# Patient Record
Sex: Male | Born: 1990 | Race: White | Hispanic: No | Marital: Single | Smoking: Never smoker
Health system: Southern US, Community
[De-identification: ages and names within clinical notes are randomized; demographics above are authoritative.]

## PROBLEM LIST (undated history)

## (undated) DIAGNOSIS — M1990 Unspecified osteoarthritis, unspecified site: Secondary | ICD-10-CM

## (undated) DIAGNOSIS — J302 Other seasonal allergic rhinitis: Secondary | ICD-10-CM

## (undated) DIAGNOSIS — N319 Neuromuscular dysfunction of bladder, unspecified: Secondary | ICD-10-CM

## (undated) DIAGNOSIS — Z8614 Personal history of Methicillin resistant Staphylococcus aureus infection: Secondary | ICD-10-CM

## (undated) DIAGNOSIS — F419 Anxiety disorder, unspecified: Secondary | ICD-10-CM

## (undated) DIAGNOSIS — Z8669 Personal history of other diseases of the nervous system and sense organs: Secondary | ICD-10-CM

## (undated) DIAGNOSIS — K219 Gastro-esophageal reflux disease without esophagitis: Secondary | ICD-10-CM

## (undated) DIAGNOSIS — Z9889 Other specified postprocedural states: Secondary | ICD-10-CM

## (undated) DIAGNOSIS — M549 Dorsalgia, unspecified: Secondary | ICD-10-CM

## (undated) DIAGNOSIS — N318 Other neuromuscular dysfunction of bladder: Secondary | ICD-10-CM

## (undated) DIAGNOSIS — R112 Nausea with vomiting, unspecified: Secondary | ICD-10-CM

## (undated) DIAGNOSIS — G809 Cerebral palsy, unspecified: Secondary | ICD-10-CM

## (undated) DIAGNOSIS — K5900 Constipation, unspecified: Secondary | ICD-10-CM

## (undated) DIAGNOSIS — H04123 Dry eye syndrome of bilateral lacrimal glands: Secondary | ICD-10-CM

## (undated) HISTORY — PX: LEG SURGERY: SHX1003

## (undated) HISTORY — PX: ESOPHAGOGASTRODUODENOSCOPY: SHX1529

## (undated) HISTORY — PX: PROGRAMABLE BACLOFEN PUMP REVISION: SHX2268

## (undated) HISTORY — PX: MYRINGOTOMY: SUR874

## (undated) HISTORY — PX: OTHER SURGICAL HISTORY: SHX169

---

## 2014-03-23 ENCOUNTER — Encounter (HOSPITAL_COMMUNITY): Payer: Self-pay | Admitting: Pharmacy Technician

## 2014-03-27 ENCOUNTER — Encounter (HOSPITAL_COMMUNITY)
Admission: RE | Admit: 2014-03-27 | Discharge: 2014-03-27 | Disposition: A | Discharge status: Home / Self Care | Payer: Medicare Other | Source: Ambulatory Visit | Attending: Oral and Maxillofacial Surgery | Admitting: Oral and Maxillofacial Surgery

## 2014-03-27 ENCOUNTER — Encounter (HOSPITAL_COMMUNITY): Payer: Self-pay

## 2014-03-27 ENCOUNTER — Ambulatory Visit (HOSPITAL_COMMUNITY)
Admission: RE | Admit: 2014-03-27 | Discharge: 2014-03-27 | Disposition: A | Discharge status: Home / Self Care | Payer: Medicare Other | Source: Ambulatory Visit | Attending: Anesthesiology | Admitting: Anesthesiology

## 2014-03-27 DIAGNOSIS — Z01810 Encounter for preprocedural cardiovascular examination: Secondary | ICD-10-CM | POA: Insufficient documentation

## 2014-03-27 DIAGNOSIS — G809 Cerebral palsy, unspecified: Secondary | ICD-10-CM | POA: Insufficient documentation

## 2014-03-27 DIAGNOSIS — Z01812 Encounter for preprocedural laboratory examination: Secondary | ICD-10-CM | POA: Insufficient documentation

## 2014-03-27 DIAGNOSIS — Z01818 Encounter for other preprocedural examination: Secondary | ICD-10-CM | POA: Insufficient documentation

## 2014-03-27 HISTORY — DX: Nausea with vomiting, unspecified: R11.2

## 2014-03-27 HISTORY — DX: Dry eye syndrome of bilateral lacrimal glands: H04.123

## 2014-03-27 HISTORY — DX: Neuromuscular dysfunction of bladder, unspecified: N31.9

## 2014-03-27 HISTORY — DX: Anxiety disorder, unspecified: F41.9

## 2014-03-27 HISTORY — DX: Gastro-esophageal reflux disease without esophagitis: K21.9

## 2014-03-27 HISTORY — DX: Nausea with vomiting, unspecified: Z98.890

## 2014-03-27 HISTORY — DX: Dorsalgia, unspecified: M54.9

## 2014-03-27 HISTORY — DX: Constipation, unspecified: K59.00

## 2014-03-27 HISTORY — DX: Personal history of other diseases of the nervous system and sense organs: Z86.69

## 2014-03-27 HISTORY — DX: Cerebral palsy, unspecified: G80.9

## 2014-03-27 HISTORY — DX: Personal history of Methicillin resistant Staphylococcus aureus infection: Z86.14

## 2014-03-27 HISTORY — DX: Other seasonal allergic rhinitis: J30.2

## 2014-03-27 HISTORY — DX: Other neuromuscular dysfunction of bladder: N31.8

## 2014-03-27 HISTORY — DX: Unspecified osteoarthritis, unspecified site: M19.90

## 2014-03-27 LAB — BASIC METABOLIC PANEL
BUN: 10 mg/dL (ref 6–23)
CO2: 25 meq/L (ref 19–32)
CREATININE: 0.78 mg/dL (ref 0.50–1.35)
Calcium: 9.7 mg/dL (ref 8.4–10.5)
Chloride: 101 mEq/L (ref 96–112)
GFR calc Af Amer: >90 mL/min (ref >90)
Glucose, Bld: 78 mg/dL (ref 70–99)
Potassium: 3.9 mEq/L (ref 3.7–5.3)
Sodium: 141 mEq/L (ref 137–147)

## 2014-03-27 LAB — CBC
HCT: 46.5 % (ref 39.0–52.0)
Hemoglobin: 15.8 g/dL (ref 13.0–17.0)
MCH: 30.4 pg (ref 26.0–34.0)
MCHC: 34 g/dL (ref 30.0–36.0)
MCV: 89.4 fL (ref 78.0–100.0)
PLATELETS: 210 10*3/uL (ref 150–400)
RBC: 5.2 MIL/uL (ref 4.22–5.81)
RDW: 12.3 % (ref 11.5–15.5)
WBC: 8.8 10*3/uL (ref 4.0–10.5)

## 2014-03-27 NOTE — Anesthesia Preprocedure Evaluation (Addendum)
Anesthesia Evaluation  Patient identified by MRN, date of birth, ID band Patient awake    Reviewed: Allergy & Precautions, H&P , NPO status , Patient's Chart, lab work & pertinent test results, reviewed documented beta blocker date and time   History of Anesthesia Complications (+) PONV and history of anesthetic complications  Airway Mallampati: I TM Distance: >3 FB Neck ROM: Full    Dental  (+) Teeth Intact, Dental Advisory Given   Pulmonary Recent URI ,  Sinus infection 03/30/14 - amoxicillin breath sounds clear to auscultation  Pulmonary exam normal       Cardiovascular negative cardio ROS  Rhythm:Regular Rate:Normal     Neuro/Psych  Headaches, Anxiety Cerebral palsy with spasticity  Inderal for migraines    GI/Hepatic Neg liver ROS, GERD-  Medicated and Controlled,  Endo/Other  negative endocrine ROS  Renal/GU negative Renal ROS Bladder dysfunction (spastic neurogenic bladder)      Musculoskeletal   Abdominal   Peds  Hematology negative hematology ROS (+)   Anesthesia Other Findings   Reproductive/Obstetrics negative OB ROS                         Anesthesia Physical Anesthesia Plan  ASA: III  Anesthesia Plan: General   Post-op Pain Management:    Induction: Intravenous  Airway Management Planned: Nasal ETT  Additional Equipment:   Intra-op Plan:   Post-operative Plan: Extubation in OR  Informed Consent: I have reviewed the patients History and Physical, chart, labs and discussed the procedure including the risks, benefits and alternatives for the proposed anesthesia with the patient or authorized representative who has indicated his/her understanding and acceptance.   Dental advisory given  Plan Discussed with:   Anesthesia Plan Comments: (See my note regarding anesthesia concerns: post-op N/V, wants foley (due to CP with neurogenic bladder), adhesive tape allergy  (paper tape ok). Shonna Chock, PA-C  Plan routine monitors, GETA)       Anesthesia Quick Evaluation

## 2014-03-27 NOTE — Pre-Procedure Instructions (Signed)
Jose FiremanJason Christensen  03/27/2014   Your procedure is scheduled on:  Mon, June 15 @ 7:30 AM  [facility]  at 5:30 AM.  Call this number if you have problems the morning of surgery: [phone]   Remember:   Do not eat food or drink liquids after midnight.   Take these medicines the morning of surgery with A SIP OF WATER: Zyrtec(Cetirizine),Toviaz(Fesoterodine),Nasonex(Mometasone),Eye Drops,Propranlol(Inderal),and Zoloft(Sertraline)              No Goody's,BC's,Aleve,Aspirin,Ibuprofen,Fish Oil,or any Herbal Medications   Do not wear jewelry  Do not wear lotions, powders, or colognes. You may wear deodorant.  Men may shave face and neck.  Do not bring valuables to the hospital.  [facility] is not responsible                  for any belongings or valuables.               Contacts, dentures or bridgework may not be worn into surgery.  Leave suitcase in the car. After surgery it may be brought to your room.  For patients admitted to the hospital, discharge time is determined by your                treatment team.               Patients discharged the day of surgery will not be allowed to drive  home.    Special Instructions:    [place] - Preparing for Surgery  Before surgery, you can play an important role.  Because skin is not sterile, your skin needs to be as free of germs as possible.  You can reduce the number of germs on you skin by washing with CHG (chlorahexidine gluconate) soap before surgery.  CHG is an antiseptic cleaner which kills germs and bonds with the skin to continue killing germs even after washing.  Please DO NOT use if you have an allergy to CHG or antibacterial soaps.  If your skin becomes reddened/irritated stop using the CHG and inform your nurse when you arrive at Short Stay.  Do not shave (including legs and underarms) for at least 48 hours prior to the first CHG shower.  You may shave your face.  Please follow these instructions  carefully:   1.  Shower with CHG Soap the night before surgery and the                                morning of Surgery.  2.  If you choose to wash your hair, wash your hair first as usual with your       normal shampoo.  3.  After you shampoo, rinse your hair and body thoroughly to remove the                      Shampoo.  4.  Use CHG as you would any other liquid soap.  You can apply chg directly       to the skin and wash gently with scrungie or a clean washcloth.  5.  Apply the CHG Soap to your body ONLY FROM THE NECK DOWN.        Do not use on open wounds or open sores.  Avoid contact with your eyes,       ears, mouth and genitals (private parts).  Wash genitals (private parts)  with your normal soap.  6.  Wash thoroughly, paying special attention to the area where your surgery        will be performed.  7.  Thoroughly rinse your body with warm water from the neck down.  8.  DO NOT shower/wash with your normal soap after using and rinsing off       the CHG Soap.  9.  Pat yourself dry with a clean towel.            10.  Wear clean pajamas.            11.  Place clean sheets on your bed the night of your first shower and do not        sleep with pets.  Day of Surgery  Do not apply any lotions/deoderants the morning of surgery.  Please wear clean clothes to the hospital/surgery center.  Con Please read over the following fact sheets that you were given: Pain Booklet, Coughing and Deep Breathing and Surgical Site Infection Prevention

## 2014-03-27 NOTE — Progress Notes (Signed)
Anesthesia PAT Evaluation:  Patient is a 23 year old male scheduled for surgical extraction of #1 and # 16 and difficult extraction of impacted # 17 and #32 by Dr Jeanice Lim on 04/03/14.  History includes non-smoker, CP with left hemiparesis, migraines (on propanolol), arthritis, back pain, GERD, constipation, spastic neurogenic bladder (self catheterizes), anxiety, MRSA, dry eyes, baclofen pump, multiple surgeries (primarily at [facility]).    Anesthesia concerns: 1) Post-operative N/V (generally does okay as long was he is given anti-emetic), 2) need for foley catheter prior to surgery, 3) allergy to adhesive tape (develops rash and itching)--says he can only use paper tape. Mom reports that he is somewhat anxious about having surgery at [facility] since all of his other surgeries have been thru [facility].   I spoke with patient and his mother during his PAT visit.  He is A&O, appropriate.  He is in a motorized wheelchair.  He denies any cardiopulmonary issues.  No recent URI.  His mom reports that he passed a prior swallow evaluation, but she thinks that he is an aspiration risk. She also says that he snores loudly, but has never been tested for OSA.  Heart RRR, no murmur.  Lungs clear.  Good mouth opening.   Preoperative EKG, CXR, and labs noted.  I did request the last two anesthesia records from [facility], but these are still pending.  If no acute changes then I would anticipate that he could proceed as planned.    Velna Ochs [facility] Short Stay Center/Anesthesiology Phone [phone] 03/27/2014 6:33 PM

## 2014-03-27 NOTE — Progress Notes (Signed)
Self catherization every 2hrs;with surgery when IV starts mom states he will start going and will need cath prior to surgery

## 2014-03-27 NOTE — Progress Notes (Signed)
Pt doesn't have a cardiologist  Denies ever having an echo/stress test/heart cath  Denies EKG or CXR in past yr  Medical is [facility]

## 2014-03-28 NOTE — H&P (Signed)
Jose Christensen is an 23 y.o. male.   Chief Complaint: Wisdom Teeth  HPI: Jose Christensen is a delightful 23 year old male with a history of cerebral palsy whom was referred to my office for extraction of wisdom teeth.   Due to his muscle contracture and limited movement from his wheel chair; it is better for him to have his surgery performed at the [facility] operating room.    PMHx:  Past Medical History  Diagnosis Date  . Cerebral palsy   . PONV (postoperative nausea and vomiting)   . History of migraine     takes Propranolol daily;last migraine a couple of months ago;mom states he spaces out and looses orientation to time  . Arthritis     hips  . Back pain     scoliosis  . GERD (gastroesophageal reflux disease)     takes Nexium daily   . Constipation     takes Miralax daily  . Spastic neurogenic bladder     takes toviaz daily  . Anxiety     takes Zoloft daily  . Dry eyes     occasionally  . Seasonal allergies     uses Nasonex daily and takes Zyrtec daily  . History of MRSA infection     several yrs ago    PSx:  Past Surgical History  Procedure Laterality Date  . Orthodpedic surgeries      total of 23-metal plates in both hips  . Arm surgery Bilateral     several times  . Leg surgery Right     titanium rod  . Programable baclofen pump revision      x2   . Bilateral eye surgery      couple of times  . Myringotomy    . Esophagogastroduodenoscopy      Family Hx: No family history on file.  Social History:  reports that he has never smoked. He does not have any smokeless tobacco history on file. He reports that he does not drink alcohol or use illicit drugs.  Allergies:  Allergies  Allergen Reactions  . Adhesive [Tape] Other (See Comments)    Takes skin off  . Compazine [Prochlorperazine] Other (See Comments)    Altered mental  . Ditropan [Oxybutynin] Other (See Comments)    Beet red  . Motrin [Ibuprofen] Other (See Comments)    Renal bleeding  . Prevacid [Lansoprazole]  Other (See Comments)    Internal pump was affected  . Reglan [Metoclopramide] Other (See Comments)    Altered mental  . Tegretol [Carbamazepine] Other (See Comments)    Vision changes  . Erythromycin Rash  . Sulfa Antibiotics Rash    Meds:  No prescriptions prior to admission    Labs:  Results for orders placed during the hospital encounter of 03/27/14 (from the past 48 hour(s))  BASIC METABOLIC PANEL     Status: None   Collection Time    03/27/14  1:44 PM      Result Value Ref Range   Sodium 141  137 - 147 mEq/L   Potassium 3.9  3.7 - 5.3 mEq/L   Chloride 101  96 - 112 mEq/L   CO2 25  19 - 32 mEq/L   Glucose, Bld 78  70 - 99 mg/dL   BUN 10  6 - 23 mg/dL   Creatinine, Ser 0.78  0.50 - 1.35 mg/dL   Calcium 9.7  8.4 - 10.5 mg/dL   GFR calc non Af Amer >90  >90 mL/min   GFR  calc Af Amer >90  >90 mL/min   Comment: (NOTE)     The eGFR has been calculated using the CKD EPI equation.     This calculation has not been validated in all clinical situations.     eGFR's persistently <90 mL/min signify possible Chronic Kidney     Disease.  CBC     Status: None   Collection Time    03/27/14  1:44 PM      Result Value Ref Range   WBC 8.8  4.0 - 10.5 K/uL   RBC 5.20  4.22 - 5.81 MIL/uL   Hemoglobin 15.8  13.0 - 17.0 g/dL   HCT 46.5  39.0 - 52.0 %   MCV 89.4  78.0 - 100.0 fL   MCH 30.4  26.0 - 34.0 pg   MCHC 34.0  30.0 - 36.0 g/dL   RDW 12.3  11.5 - 15.5 %   Platelets 210  150 - 400 K/uL    Radiology: Dg Chest 2 View  03/27/2014   CLINICAL DATA:  Preoperative evaluation for wisdom teeth extraction, history cerebral palsy  EXAM: CHEST  2 VIEW  COMPARISON:  None  FINDINGS: Normal heart size, mediastinal contours and pulmonary vascularity.  Lungs clear.  No pleural effusion or pneumothorax.  Levoconvex thoracic scoliosis.  IMPRESSION: No acute abnormalities.   Electronically Signed   By: Lavonia Dana M.D.   On: 03/27/2014 14:27    ROS: Pertinent items are noted in HPI.  Vitals:  There were no vitals taken for this visit.  Physical Exam: General appearance: alert, cooperative and cerebral palsy Head: Normocephalic, without obvious abnormality, atraumatic Eyes: conjunctivae/corneas clear. PERRL, EOM's intact. Fundi benign. Ears: normal TM's and external ear canals both ears Nose: Nares normal. Septum midline. Mucosa normal. No drainage or sinus tenderness. Throat: lips, mucosa, and tongue normal; teeth and gums normal Resp: clear to auscultation bilaterally Cardio: regular rate and rhythm, S1, S2 normal, no murmur, click, rub or gallop GI: soft, non-tender; bowel sounds normal; no masses,  no organomegaly and Baclofen pump lower right quadrant abdomen Extremities: weakness at all extremities Pulses: 2+ and symmetric Full bony impacted #17, and 32 and erupted #1 and #16 which are occluding into the area of #17 and #32.  Assessment/Plan Non-functional #1, 16 and full bony impacted #17 and #32.  Extraction of #1, 16, 17, 32 in the OR . The patient will need a foley during the procedure.  [place]  03/28/2014, 4:36 PM

## 2014-04-02 MED ORDER — CEFAZOLIN SODIUM-DEXTROSE 2-3 GM-% IV SOLR
2.0000 g | INTRAVENOUS | Status: AC
  Administered 2014-04-03: 2 g via INTRAVENOUS
  Filled 2014-04-02: qty 50

## 2014-04-03 ENCOUNTER — Encounter (HOSPITAL_COMMUNITY)
Admission: RE | Disposition: A | Discharge status: Home / Self Care | Payer: Self-pay | Source: Ambulatory Visit | Attending: Oral and Maxillofacial Surgery

## 2014-04-03 ENCOUNTER — Encounter (HOSPITAL_COMMUNITY): Payer: Self-pay | Admitting: Pharmacy Technician

## 2014-04-03 ENCOUNTER — Ambulatory Visit (HOSPITAL_COMMUNITY): Payer: Medicare Other | Admitting: Anesthesiology

## 2014-04-03 ENCOUNTER — Encounter (HOSPITAL_COMMUNITY): Payer: Self-pay | Admitting: *Deleted

## 2014-04-03 ENCOUNTER — Ambulatory Visit (HOSPITAL_COMMUNITY)
Admission: RE | Admit: 2014-04-03 | Discharge: 2014-04-03 | Disposition: A | Discharge status: Home / Self Care | Payer: Medicare Other | Source: Ambulatory Visit | Attending: Oral and Maxillofacial Surgery | Admitting: Oral and Maxillofacial Surgery

## 2014-04-03 ENCOUNTER — Encounter (HOSPITAL_COMMUNITY): Payer: Medicare Other | Admitting: Vascular Surgery

## 2014-04-03 DIAGNOSIS — K011 Impacted teeth: Secondary | ICD-10-CM

## 2014-04-03 DIAGNOSIS — N319 Neuromuscular dysfunction of bladder, unspecified: Secondary | ICD-10-CM | POA: Insufficient documentation

## 2014-04-03 DIAGNOSIS — Z888 Allergy status to other drugs, medicaments and biological substances status: Secondary | ICD-10-CM | POA: Insufficient documentation

## 2014-04-03 DIAGNOSIS — G809 Cerebral palsy, unspecified: Secondary | ICD-10-CM | POA: Insufficient documentation

## 2014-04-03 DIAGNOSIS — K5900 Constipation, unspecified: Secondary | ICD-10-CM | POA: Insufficient documentation

## 2014-04-03 DIAGNOSIS — F411 Generalized anxiety disorder: Secondary | ICD-10-CM | POA: Insufficient documentation

## 2014-04-03 DIAGNOSIS — Z886 Allergy status to analgesic agent status: Secondary | ICD-10-CM | POA: Insufficient documentation

## 2014-04-03 DIAGNOSIS — K006 Disturbances in tooth eruption: Secondary | ICD-10-CM | POA: Insufficient documentation

## 2014-04-03 DIAGNOSIS — G43909 Migraine, unspecified, not intractable, without status migrainosus: Secondary | ICD-10-CM | POA: Insufficient documentation

## 2014-04-03 DIAGNOSIS — Z882 Allergy status to sulfonamides status: Secondary | ICD-10-CM | POA: Insufficient documentation

## 2014-04-03 DIAGNOSIS — M4120 Other idiopathic scoliosis, site unspecified: Secondary | ICD-10-CM | POA: Insufficient documentation

## 2014-04-03 DIAGNOSIS — M1610 Unilateral primary osteoarthritis, unspecified hip: Secondary | ICD-10-CM | POA: Insufficient documentation

## 2014-04-03 DIAGNOSIS — Z881 Allergy status to other antibiotic agents status: Secondary | ICD-10-CM | POA: Insufficient documentation

## 2014-04-03 DIAGNOSIS — K219 Gastro-esophageal reflux disease without esophagitis: Secondary | ICD-10-CM | POA: Insufficient documentation

## 2014-04-03 HISTORY — PX: TOOTH EXTRACTION: SHX859

## 2014-04-03 SURGERY — EXTRACTION, TOOTH, MOLAR
Anesthesia: General | Site: Mouth

## 2014-04-03 MED ORDER — NEOSTIGMINE METHYLSULFATE 10 MG/10ML IV SOLN
INTRAVENOUS | Status: DC | PRN
  Administered 2014-04-03: 4 mg via INTRAVENOUS

## 2014-04-03 MED ORDER — ONDANSETRON HCL 4 MG/2ML IJ SOLN
INTRAMUSCULAR | Status: AC
  Filled 2014-04-03: qty 2

## 2014-04-03 MED ORDER — PROPOFOL 10 MG/ML IV BOLUS
INTRAVENOUS | Status: AC
  Filled 2014-04-03: qty 20

## 2014-04-03 MED ORDER — FENTANYL CITRATE 0.05 MG/ML IJ SOLN
INTRAMUSCULAR | Status: DC | PRN
  Administered 2014-04-03 (×3): 50 ug via INTRAVENOUS

## 2014-04-03 MED ORDER — EPHEDRINE SULFATE 50 MG/ML IJ SOLN
INTRAMUSCULAR | Status: AC
  Filled 2014-04-03: qty 1

## 2014-04-03 MED ORDER — HYDROMORPHONE HCL PF 1 MG/ML IJ SOLN
0.2500 mg | INTRAMUSCULAR | Status: DC | PRN
Start: 2014-04-03 — End: 2014-04-03

## 2014-04-03 MED ORDER — LIDOCAINE HCL (CARDIAC) 20 MG/ML IV SOLN
INTRAVENOUS | Status: AC
  Filled 2014-04-03: qty 5

## 2014-04-03 MED ORDER — MEPERIDINE HCL 25 MG/ML IJ SOLN: 6.2500 mg | INTRAMUSCULAR | Status: DC | PRN

## 2014-04-03 MED ORDER — BUPIVACAINE-EPINEPHRINE 0.5% -1:200000 IJ SOLN
INTRAMUSCULAR | Status: DC | PRN
  Administered 2014-04-03: 7.2 mL

## 2014-04-03 MED ORDER — GLYCOPYRROLATE 0.2 MG/ML IJ SOLN
INTRAMUSCULAR | Status: DC | PRN
  Administered 2014-04-03: .8 mg via INTRAVENOUS

## 2014-04-03 MED ORDER — GLYCOPYRROLATE 0.2 MG/ML IJ SOLN
INTRAMUSCULAR | Status: AC
  Filled 2014-04-03: qty 2

## 2014-04-03 MED ORDER — LIDOCAINE-EPINEPHRINE 2 %-1:100000 IJ SOLN
INTRAMUSCULAR | Status: AC
  Filled 2014-04-03: qty 6.8

## 2014-04-03 MED ORDER — OXYCODONE HCL 5 MG/5ML PO SOLN
ORAL | Status: AC
  Filled 2014-04-03: qty 5

## 2014-04-03 MED ORDER — SUCCINYLCHOLINE CHLORIDE 20 MG/ML IJ SOLN
INTRAMUSCULAR | Status: AC
  Filled 2014-04-03: qty 1

## 2014-04-03 MED ORDER — SODIUM CHLORIDE 0.9 % IJ SOLN
INTRAMUSCULAR | Status: AC
  Filled 2014-04-03: qty 10

## 2014-04-03 MED ORDER — OXYMETAZOLINE HCL 0.05 % NA SOLN
NASAL | Status: DC | PRN
  Administered 2014-04-03: 1 via NASAL

## 2014-04-03 MED ORDER — ONDANSETRON HCL 4 MG/2ML IJ SOLN
INTRAMUSCULAR | Status: DC | PRN
  Administered 2014-04-03: 4 mg via INTRAVENOUS

## 2014-04-03 MED ORDER — ROCURONIUM BROMIDE 100 MG/10ML IV SOLN
INTRAVENOUS | Status: DC | PRN
  Administered 2014-04-03: 25 mg via INTRAVENOUS

## 2014-04-03 MED ORDER — FENTANYL CITRATE 0.05 MG/ML IJ SOLN
INTRAMUSCULAR | Status: AC
  Filled 2014-04-03: qty 5

## 2014-04-03 MED ORDER — OXYCODONE HCL 5 MG/5ML PO SOLN
5.0000 mg | Freq: Once | ORAL | Status: AC | PRN
  Administered 2014-04-03: 5 mg via ORAL

## 2014-04-03 MED ORDER — LIDOCAINE HCL (CARDIAC) 20 MG/ML IV SOLN
INTRAVENOUS | Status: DC | PRN
  Administered 2014-04-03: 30 mg via INTRAVENOUS

## 2014-04-03 MED ORDER — GELATIN ABSORBABLE 12-7 MM EX MISC
CUTANEOUS | Status: DC | PRN
  Administered 2014-04-03: 2

## 2014-04-03 MED ORDER — PHENYLEPHRINE 40 MCG/ML (10ML) SYRINGE FOR IV PUSH (FOR BLOOD PRESSURE SUPPORT)
PREFILLED_SYRINGE | INTRAVENOUS | Status: AC
  Filled 2014-04-03: qty 10

## 2014-04-03 MED ORDER — BUPIVACAINE-EPINEPHRINE (PF) 0.5% -1:200000 IJ SOLN
INTRAMUSCULAR | Status: AC
  Filled 2014-04-03: qty 7.2

## 2014-04-03 MED ORDER — ARTIFICIAL TEARS OP OINT
TOPICAL_OINTMENT | OPHTHALMIC | Status: AC
  Filled 2014-04-03: qty 3.5

## 2014-04-03 MED ORDER — ROCURONIUM BROMIDE 50 MG/5ML IV SOLN
INTRAVENOUS | Status: AC
  Filled 2014-04-03: qty 1

## 2014-04-03 MED ORDER — DEXAMETHASONE SODIUM PHOSPHATE 4 MG/ML IJ SOLN
INTRAMUSCULAR | Status: DC | PRN
  Administered 2014-04-03: 8 mg via INTRAVENOUS

## 2014-04-03 MED ORDER — PROPOFOL 10 MG/ML IV BOLUS
INTRAVENOUS | Status: DC | PRN
  Administered 2014-04-03: 200 mg via INTRAVENOUS

## 2014-04-03 MED ORDER — PROMETHAZINE HCL 25 MG/ML IJ SOLN: 6.2500 mg | INTRAMUSCULAR | Status: DC | PRN

## 2014-04-03 MED ORDER — MIDAZOLAM HCL 2 MG/2ML IJ SOLN: 0.5000 mg | Freq: Once | INTRAMUSCULAR | Status: DC | PRN

## 2014-04-03 MED ORDER — EPHEDRINE SULFATE 50 MG/ML IJ SOLN
INTRAMUSCULAR | Status: DC | PRN
  Administered 2014-04-03: 10 mg via INTRAVENOUS

## 2014-04-03 MED ORDER — LACTATED RINGERS IV SOLN
INTRAVENOUS | Status: DC | PRN
  Administered 2014-04-03 (×2): via INTRAVENOUS

## 2014-04-03 MED ORDER — GLYCOPYRROLATE 0.2 MG/ML IJ SOLN
INTRAMUSCULAR | Status: AC
  Filled 2014-04-03: qty 1

## 2014-04-03 MED ORDER — LIDOCAINE-EPINEPHRINE 2 %-1:100000 IJ SOLN
INTRAMUSCULAR | Status: AC
  Filled 2014-04-03: qty 3.4

## 2014-04-03 MED ORDER — MIDAZOLAM HCL 2 MG/2ML IJ SOLN
INTRAMUSCULAR | Status: AC
  Filled 2014-04-03: qty 2

## 2014-04-03 MED ORDER — MIDAZOLAM HCL 5 MG/5ML IJ SOLN
INTRAMUSCULAR | Status: DC | PRN
  Administered 2014-04-03: 1 mg via INTRAVENOUS

## 2014-04-03 MED ORDER — NEOSTIGMINE METHYLSULFATE 10 MG/10ML IV SOLN
INTRAVENOUS | Status: AC
  Filled 2014-04-03: qty 1

## 2014-04-03 MED ORDER — OXYMETAZOLINE HCL 0.05 % NA SOLN
NASAL | Status: AC
  Filled 2014-04-03: qty 15

## 2014-04-03 MED ORDER — OXYCODONE HCL 5 MG PO TABS: 5.0000 mg | ORAL_TABLET | Freq: Once | ORAL | Status: AC | PRN

## 2014-04-03 MED ORDER — LIDOCAINE-EPINEPHRINE 2 %-1:100000 IJ SOLN
INTRAMUSCULAR | Status: DC | PRN
  Administered 2014-04-03: 10.2 mL

## 2014-04-03 SURGICAL SUPPLY — 50 items
ALCOHOL ISOPROPYL (RUBBING) (MISCELLANEOUS) ×2 IMPLANT
ATTRACTOMAT 16X20 MAGNETIC DRP (DRAPES) ×2 IMPLANT
BLADE 10 SAFETY STRL DISP (BLADE) IMPLANT
BLADE SURG 15 STRL LF DISP TIS (BLADE) ×1 IMPLANT
BLADE SURG 15 STRL SS (BLADE) ×1
BUR CROSS CUT (BURR)
BUR CROSS CUT FISSURE 1.6 (BURR) ×2 IMPLANT
BUR RND FLUTED 2.5 (BURR) IMPLANT
BUR SRG MED 1.2XXCUT FSSR (BURR) IMPLANT
BUR SRG MED 1.6XXCUT FSSR (BURR) IMPLANT
BUR SRG MED 2.1XXCUT FSSR (BURR) IMPLANT
BUR STRYKR 2.5 FLUT MED (BURR) IMPLANT
BURR SRG MED 1.2XXCUT FSSR (BURR)
BURR SRG MED 1.6XXCUT FSSR (BURR)
BURR SRG MED 2.1XXCUT FSSR (BURR)
CANISTER SUCTION 2500CC (MISCELLANEOUS) ×2 IMPLANT
CLEANER TIP ELECTROSURG 2X2 (MISCELLANEOUS) IMPLANT
CONT SPEC STER OR (MISCELLANEOUS) IMPLANT
COVER SURGICAL LIGHT HANDLE (MISCELLANEOUS) ×2 IMPLANT
DRESSING ADAPTIC 1/2  N-ADH (PACKING) IMPLANT
ELECT COATED BLADE 2.86 ST (ELECTRODE) IMPLANT
ELECT REM PT RETURN 9FT ADLT (ELECTROSURGICAL) ×2
ELECTRODE REM PT RTRN 9FT ADLT (ELECTROSURGICAL) ×1 IMPLANT
GAUZE PACKING FOLDED 2  STR (GAUZE/BANDAGES/DRESSINGS) ×1
GAUZE PACKING FOLDED 2 STR (GAUZE/BANDAGES/DRESSINGS) ×1 IMPLANT
GLOVE BIO SURGEON STRL SZ7 (GLOVE) ×2 IMPLANT
GLOVE BIOGEL PI IND STRL 7.5 (GLOVE) ×1 IMPLANT
GLOVE BIOGEL PI INDICATOR 7.5 (GLOVE) ×1
GLOVE ORTHO TXT STRL SZ7.5 (GLOVE) ×2 IMPLANT
GLOVE SURG SS PI 7.0 STRL IVOR (GLOVE) ×2 IMPLANT
GOWN STRL REUS W/ TWL LRG LVL3 (GOWN DISPOSABLE) ×2 IMPLANT
GOWN STRL REUS W/TWL LRG LVL3 (GOWN DISPOSABLE) ×2
KIT BASIN OR (CUSTOM PROCEDURE TRAY) ×2 IMPLANT
KIT ROOM TURNOVER OR (KITS) ×2 IMPLANT
NEEDLE BLUNT 16X1.5 OR ONLY (NEEDLE) ×4 IMPLANT
NEEDLE DENTAL 27 LONG (NEEDLE) ×4 IMPLANT
NS IRRIG 1000ML POUR BTL (IV SOLUTION) ×2 IMPLANT
PAD ARMBOARD 7.5X6 YLW CONV (MISCELLANEOUS) ×4 IMPLANT
PENCIL BUTTON HOLSTER BLD 10FT (ELECTRODE) IMPLANT
SOLUTION BETADINE 4OZ (MISCELLANEOUS) ×2 IMPLANT
SPONGE GAUZE 4X4 12PLY (GAUZE/BANDAGES/DRESSINGS) IMPLANT
SPONGE SURGIFOAM ABS GEL 12-7 (HEMOSTASIS) ×4 IMPLANT
SUT CHROMIC 3 0 PS 2 (SUTURE) ×4 IMPLANT
SYR 50ML SLIP (SYRINGE) ×4 IMPLANT
TOOTHBRUSH ADULT (PERSONAL CARE ITEMS) ×2 IMPLANT
TOWEL OR 17X24 6PK STRL BLUE (TOWEL DISPOSABLE) IMPLANT
TOWEL OR 17X26 10 PK STRL BLUE (TOWEL DISPOSABLE) ×2 IMPLANT
TRAY ENT MC OR (CUSTOM PROCEDURE TRAY) ×2 IMPLANT
TRAY FOLEY CATH 14FR (SET/KITS/TRAYS/PACK) ×2 IMPLANT
WATER STERILE IRR 1000ML POUR (IV SOLUTION) IMPLANT

## 2014-04-03 NOTE — Discharge Instructions (Signed)
HOME CARE INSTRUCTIONS DENTAL PROCEDURES  MEDICATION: Some soreness and discomfort is normal following a dental procedure.  Use of a non-aspirin pain product, like acetaminophen, is recommended.  If pain is not relieved, please call the surgeon who performed the procedure.  ORAL HYGIENE: Brushing of the teeth should be resumed the day after surgery.  Begin slowly and softly.  In children, brushing should be done by the parent after every meal.  DIET: A balanced diet is very important during the healing process.   Liquids and soft foods are advisable.  Drink clear liquids at first, then progress to other liquids as tolerated.  If teeth were removed, do not use a straw for at least 2 days.  Try to limit between-meal snacks which are high in sugar.  ACTIVITY: Limit to quiet indoor activities for 24 hours following surgery.  RETURN TO SCHOOL OR WORK: You may return to school or work in a day or two, or as indicated by Designer, industrial/productyour surgeon.  GENERAL EXPECTATIONS:  -Bleeding is to be expected after teeth are removed.  The bleeding should slow down after several hours.  -Stitches may be in place, which will fall out by themselves.  If the child pulls them out, do not be concerned.  CALL YOUR DOCTOR IS THESE OCCUR:  -Temperature is 101 degrees or more.  -Persistent bright red bleeding.  -Severe pain.  Return to the doctor's office in two weeks. Call to make an appointment.  Patient Signature:  ________________________________________________________  Nurse's Signature:  ________________________________________________________

## 2014-04-03 NOTE — Brief Op Note (Signed)
04/03/2014  7:52 AM  PATIENT:  Jose Christensen  23 y.o. male  PRE-OPERATIVE DIAGNOSIS:  Nonfunctional teeth #1, 16; and Full bony impacted teeth 17  32  POST-OPERATIVE DIAGNOSIS: Non-functional teeth #1, 16; and Full bony impacted #17, 32  PROCEDURE:   Surgical extraction of #1 and #16 Difficult extraction of impacted #17 and #32  SURGEON:  Surgeon(s) and Role:    * Francene Findershristopher L [place], DDS - Primary  PHYSICIAN ASSISTANT: None  ASSISTANTS: Harrie ForemanNicole Beck   ANESTHESIA:   general  EBL:  Minimal  BLOOD ADMINISTERED:none  DRAINS: Urinary Catheter (Foley) - removed at the conclusion of case.  LOCAL MEDICATIONS USED:  0.5% MARCAINE 1:200,000 epinephrine - 4 carpules  and 2% LIDOCAINE with 1:100,000 epinephrine - 6 carpules  SPECIMEN:  No Specimen  DISPOSITION OF SPECIMEN:  N/A  COUNTS:  YES  TOURNIQUET:  * No tourniquets in log *  DICTATION: .Note written in EPIC  PLAN OF CARE: Discharge to home after PACU  PATIENT DISPOSITION:  PACU - hemodynamically stable.   Delay start of Pharmacological VTE agent (>24hrs) due to surgical blood loss or risk of bleeding: not applicable

## 2014-04-03 NOTE — Anesthesia Postprocedure Evaluation (Signed)
  Anesthesia Post-op Note  Patient: Jose Christensen  Procedure(s) Performed: Procedure(s): SURGICAL EXTRACTION TEETH #1 AND #16; DIFFICULT EXTRACTION OF IMPACTED TEETH #17 AND #32 (N/A)  Patient Location: PACU  Anesthesia Type:General  Level of Consciousness: awake, alert , oriented and patient cooperative  Airway and Oxygen Therapy: Patient Spontanous Breathing  Post-op Pain: none  Post-op Assessment: Post-op Vital signs reviewed, Patient's Cardiovascular Status Stable, Respiratory Function Stable, Patent Airway, No signs of Nausea or vomiting and Pain level controlled  Post-op Vital Signs: Reviewed and stable  Last Vitals:  Filed Vitals:   04/03/14 0940  BP: 145/87  Pulse: 72  Temp:   Resp: 13    Complications: No apparent anesthesia complications

## 2014-04-03 NOTE — Anesthesia Procedure Notes (Signed)
Procedure Name: Intubation Date/Time: 04/03/2014 7:48 AM Performed by: Lovie CholOCK, Breta Demedeiros K Pre-anesthesia Checklist: Patient identified, Emergency Drugs available, Suction available, Patient being monitored and Timeout performed Patient Re-evaluated:Patient Re-evaluated prior to inductionOxygen Delivery Method: Circle system utilized Preoxygenation: Pre-oxygenation with 100% oxygen Intubation Type: IV induction Ventilation: Mask ventilation without difficulty Laryngoscope Size: Mac and 3 Grade View: Grade I Nasal Tubes: Right and Nasal Rae Tube size: 7.5 mm Number of attempts: 1 Placement Confirmation: ETT inserted through vocal cords under direct vision,  positive ETCO2,  CO2 detector and breath sounds checked- equal and bilateral Secured at: 28 (@ hub) cm Tube secured with: Tape Dental Injury: Teeth and Oropharynx as per pre-operative assessment

## 2014-04-03 NOTE — Interval H&P Note (Signed)
History and Physical Interval Note:  04/03/2014 7:12 AM  Jose Christensen  has presented today for surgery, with the diagnosis of nonfunctional teeth 1, 16 difficult impacted teeth 17  32  The various methods of treatment have been discussed with the patient and family. After consideration of risks, benefits and other options for treatment, the patient has consented to  Procedure(s): SURGICAL EXTRACTION #1 & #16 and DIFFICULT EXTRACTION OF IMPACTED #17 & #32 (N/A) as a surgical intervention .  The patient's history has been reviewed, patient examined, no change in status, stable for surgery.  I have reviewed the patient's chart and labs.  Questions were answered to the patient's satisfaction.     [place],Jillianna Stanek L

## 2014-04-03 NOTE — Transfer of Care (Signed)
Immediate Anesthesia Transfer of Care Note  Patient: Jose Christensen  Procedure(s) Performed: Procedure(s): SURGICAL EXTRACTION TEETH #1 AND #16; DIFFICULT EXTRACTION OF IMPACTED TEETH #17 AND #32 (N/A)  Patient Location: PACU  Anesthesia Type:General  Level of Consciousness: awake, alert , oriented and patient cooperative  Airway & Oxygen Therapy: Patient Spontanous Breathing and Patient connected to nasal cannula oxygen  Post-op Assessment: Report given to PACU RN and Post -op Vital signs reviewed and stable  Post vital signs: Reviewed  Complications: No apparent anesthesia complications

## 2014-04-03 NOTE — Op Note (Signed)
04/03/2014  7:56 AM  PATIENT:  Jose Christensen  23 y.o. male  PRE-OPERATIVE DIAGNOSIS:  Nonfunctional teeth #1, 16; and Full bony impacted teeth 17  32  POST-OPERATIVE DIAGNOSIS: Non-functional teeth #1, 16; and Full bony impacted #17, 32  INDICATIONS FOR PROCEDURE: The patient is a 23 year old male with a history of cerebral palsy who was referred to our office by his general dentist for the extraction of his third molar teeth due to pain.  The patient was deemed appropriate for general anesthesia; however, he will be taken to the operating room for his procedure.    PROCEDURE:   Surgical extraction of #1 and #16 Difficult extraction of impacted #17 and #32  SURGEON:  Surgeon(s) and Role:    * Francene Findershristopher L [place], DDS - Primary  PHYSICIAN ASSISTANT: None  ASSISTANTS: Harrie ForemanNicole Beck   PROCEDURE IN DETAIL:   The patient was seen in the pre-operative area and his questions were answered.  The consent was signed and verified.  The history and physical was updated.  The patient was taken to the operating room by the anesthesia service and placed on the table in a supine position.  The patient was successfully intubated with a nasal tube.  Next, 0.5% MARCAINE 1:200,000 epinephrine (4 carpules)  and 2% LIDOCAINE with 1:100,000 epinephrine (6carpules) were used to anesthetize the patient the left and right maxilla and mandible in the region of #1, 16, 17, and 32.  Bilateral infiltrations in the maxilla were used.   Bilateral inferior alveolar and long buccal nerve blocks were used on the mandible.  Next, a 15 blade was used to make a full thickness mucoperiosteal flap with a distal release to expose #17.  A surgical drill was used to make a trough in the bone and then section the tooth.  The tooth was removed with hand instruments.  Copious irrigation was used with normal saline.  The incision was closed with 3-0 gut suture and this was then repeated for teeth #1, #16, and #32.  All four sites were  hemostatic at the end of the surgery.  An orogastric tube was passed in and out at the end of the surgery.    All counts were correct.  The patient was extubated and and taken to the recovery area in a stable fashion.  ANESTHESIA:   general  EBL:   Minimal  BLOOD ADMINISTERED:none  DRAINS: Urinary Catheter (Foley) - removed at the conclusion of the case  LOCAL MEDICATIONS USED:  0.5% MARCAINE 1:200,000 epinephrine - 4 carpules  and 2% LIDOCAINE with 1:100,000 epinephrine - 6 carpules  SPECIMEN:  No Specimen  DISPOSITION OF SPECIMEN:  N/A  COUNTS:  YES  TOURNIQUET:  * No tourniquets in log *  PLAN OF CARE: Discharge to home after PACU  PATIENT DISPOSITION:  PACU - hemodynamically stable.   Delay start of Pharmacological VTE agent (>24hrs) due to surgical blood loss or risk of bleeding: not applicable

## 2014-04-04 ENCOUNTER — Encounter (HOSPITAL_COMMUNITY): Payer: Self-pay | Admitting: Oral and Maxillofacial Surgery

## 2014-12-28 ENCOUNTER — Emergency Department (HOSPITAL_BASED_OUTPATIENT_CLINIC_OR_DEPARTMENT_OTHER)
Admission: EM | Admit: 2014-12-28 | Discharge: 2014-12-28 | Disposition: A | Discharge status: Home / Self Care | Payer: Medicare Other | Attending: Emergency Medicine | Admitting: Emergency Medicine

## 2014-12-28 ENCOUNTER — Emergency Department (HOSPITAL_BASED_OUTPATIENT_CLINIC_OR_DEPARTMENT_OTHER): Payer: Medicare Other

## 2014-12-28 ENCOUNTER — Encounter (HOSPITAL_BASED_OUTPATIENT_CLINIC_OR_DEPARTMENT_OTHER): Payer: Self-pay | Admitting: *Deleted

## 2014-12-28 ENCOUNTER — Other Ambulatory Visit (HOSPITAL_BASED_OUTPATIENT_CLINIC_OR_DEPARTMENT_OTHER): Payer: Medicare Other

## 2014-12-28 ENCOUNTER — Emergency Department: Payer: Medicare Other

## 2014-12-28 DIAGNOSIS — Z7951 Long term (current) use of inhaled steroids: Secondary | ICD-10-CM | POA: Diagnosis not present

## 2014-12-28 DIAGNOSIS — Y998 Other external cause status: Secondary | ICD-10-CM | POA: Diagnosis not present

## 2014-12-28 DIAGNOSIS — F419 Anxiety disorder, unspecified: Secondary | ICD-10-CM | POA: Diagnosis not present

## 2014-12-28 DIAGNOSIS — Z8669 Personal history of other diseases of the nervous system and sense organs: Secondary | ICD-10-CM | POA: Diagnosis not present

## 2014-12-28 DIAGNOSIS — Y9389 Activity, other specified: Secondary | ICD-10-CM | POA: Insufficient documentation

## 2014-12-28 DIAGNOSIS — M1990 Unspecified osteoarthritis, unspecified site: Secondary | ICD-10-CM | POA: Diagnosis not present

## 2014-12-28 DIAGNOSIS — M25562 Pain in left knee: Secondary | ICD-10-CM

## 2014-12-28 DIAGNOSIS — K219 Gastro-esophageal reflux disease without esophagitis: Secondary | ICD-10-CM | POA: Diagnosis not present

## 2014-12-28 DIAGNOSIS — Y9289 Other specified places as the place of occurrence of the external cause: Secondary | ICD-10-CM | POA: Insufficient documentation

## 2014-12-28 DIAGNOSIS — S8992XA Unspecified injury of left lower leg, initial encounter: Secondary | ICD-10-CM | POA: Diagnosis not present

## 2014-12-28 DIAGNOSIS — X58XXXA Exposure to other specified factors, initial encounter: Secondary | ICD-10-CM | POA: Diagnosis not present

## 2014-12-28 DIAGNOSIS — K5900 Constipation, unspecified: Secondary | ICD-10-CM | POA: Diagnosis not present

## 2014-12-28 DIAGNOSIS — Z8614 Personal history of Methicillin resistant Staphylococcus aureus infection: Secondary | ICD-10-CM | POA: Insufficient documentation

## 2014-12-28 DIAGNOSIS — Z8679 Personal history of other diseases of the circulatory system: Secondary | ICD-10-CM | POA: Diagnosis not present

## 2014-12-28 DIAGNOSIS — S8990XA Unspecified injury of unspecified lower leg, initial encounter: Secondary | ICD-10-CM

## 2014-12-28 DIAGNOSIS — Z79899 Other long term (current) drug therapy: Secondary | ICD-10-CM | POA: Insufficient documentation

## 2014-12-28 DIAGNOSIS — M419 Scoliosis, unspecified: Secondary | ICD-10-CM | POA: Insufficient documentation

## 2014-12-28 NOTE — Discharge Instructions (Signed)
Please call your doctor for a followup appointment within 24-48 hours. When you talk to your doctor please let them know that you were seen in the emergency department and have them acquire all of your records so that they can discuss the findings with you and formulate a treatment plan to fully care for your new and ongoing problems. Please call and set-up an appointment with your primary care provider Please call and set-up an appointment with your orthopedics Please keep knee sleeve on at all times Please apply cool compressions such as ice Please continue to monitor symptoms closely and if symptoms are to worsen or change (fever greater than 101, chills, sweating, nausea, vomiting, chest pain, shortness of breathe, difficulty breathing, weakness, numbness, tingling, worsening or changes to pain pattern, increased swelling, red streaks, swelling to the leg, changes to skin colored) please report back to the Emergency Department immediately.   Knee Pain The knee is the complex joint between your thigh and your lower leg. It is made up of bones, tendons, ligaments, and cartilage. The bones that make up the knee are:  The femur in the thigh.  The tibia and fibula in the lower leg.  The patella or kneecap riding in the groove on the lower femur. CAUSES  Knee pain is a common complaint with many causes. A few of these causes are:  Injury, such as:  A ruptured ligament or tendon injury.  Torn cartilage.  Medical conditions, such as:  Gout  Arthritis  Infections  Overuse, over training, or overdoing a physical activity. Knee pain can be minor or severe. Knee pain can accompany debilitating injury. Minor knee problems often respond well to self-care measures or get well on their own. More serious injuries may need medical intervention or even surgery. SYMPTOMS The knee is complex. Symptoms of knee problems can vary widely. Some of the problems are:  Pain with movement and weight  bearing.  Swelling and tenderness.  Buckling of the knee.  Inability to straighten or extend your knee.  Your knee locks and you cannot straighten it.  Warmth and redness with pain and fever.  Deformity or dislocation of the kneecap. DIAGNOSIS  Determining what is wrong may be very straight forward such as when there is an injury. It can also be challenging because of the complexity of the knee. Tests to make a diagnosis may include:  Your caregiver taking a history and doing a physical exam.  Routine X-rays can be used to rule out other problems. X-rays will not reveal a cartilage tear. Some injuries of the knee can be diagnosed by:  Arthroscopy a surgical technique by which a small video camera is inserted through tiny incisions on the sides of the knee. This procedure is used to examine and repair internal knee joint problems. Tiny instruments can be used during arthroscopy to repair the torn knee cartilage (meniscus).  Arthrography is a radiology technique. A contrast liquid is directly injected into the knee joint. Internal structures of the knee joint then become visible on X-ray film.  An MRI scan is a non X-ray radiology procedure in which magnetic fields and a computer produce two- or three-dimensional images of the inside of the knee. Cartilage tears are often visible using an MRI scanner. MRI scans have largely replaced arthrography in diagnosing cartilage tears of the knee.  Blood work.  Examination of the fluid that helps to lubricate the knee joint (synovial fluid). This is done by taking a sample out using a needle  and a syringe. TREATMENT The treatment of knee problems depends on the cause. Some of these treatments are:  Depending on the injury, proper casting, splinting, surgery, or physical therapy care will be needed.  Give yourself adequate recovery time. Do not overuse your joints. If you begin to get sore during workout routines, back off. Slow down or do fewer  repetitions.  For repetitive activities such as cycling or running, maintain your strength and nutrition.  Alternate muscle groups. For example, if you are a weight lifter, work the upper body on one day and the lower body the next.  Either tight or weak muscles do not give the proper support for your knee. Tight or weak muscles do not absorb the stress placed on the knee joint. Keep the muscles surrounding the knee strong.  Take care of mechanical problems.  If you have flat feet, orthotics or special shoes may help. See your caregiver if you need help.  Arch supports, sometimes with wedges on the inner or outer aspect of the heel, can help. These can shift pressure away from the side of the knee most bothered by osteoarthritis.  A brace called an "unloader" brace also may be used to help ease the pressure on the most arthritic side of the knee.  If your caregiver has prescribed crutches, braces, wraps or ice, use as directed. The acronym for this is PRICE. This means protection, rest, ice, compression, and elevation.  Nonsteroidal anti-inflammatory drugs (NSAIDs), can help relieve pain. But if taken immediately after an injury, they may actually increase swelling. Take NSAIDs with food in your stomach. Stop them if you develop stomach problems. Do not take these if you have a history of ulcers, stomach pain, or bleeding from the bowel. Do not take without your caregiver's approval if you have problems with fluid retention, heart failure, or kidney problems.  For ongoing knee problems, physical therapy may be helpful.  Glucosamine and chondroitin are over-the-counter dietary supplements. Both may help relieve the pain of osteoarthritis in the knee. These medicines are different from the usual anti-inflammatory drugs. Glucosamine may decrease the rate of cartilage destruction.  Injections of a corticosteroid drug into your knee joint may help reduce the symptoms of an arthritis flare-up. They  may provide pain relief that lasts a few months. You may have to wait a few months between injections. The injections do have a small increased risk of infection, water retention, and elevated blood sugar levels.  Hyaluronic acid injected into damaged joints may ease pain and provide lubrication. These injections may work by reducing inflammation. A series of shots may give relief for as long as 6 months.  Topical painkillers. Applying certain ointments to your skin may help relieve the pain and stiffness of osteoarthritis. Ask your pharmacist for suggestions. Many over the-counter products are approved for temporary relief of arthritis pain.  In some countries, doctors often prescribe topical NSAIDs for relief of chronic conditions such as arthritis and tendinitis. A review of treatment with NSAID creams found that they worked as well as oral medications but without the serious side effects. PREVENTION  Maintain a healthy weight. Extra pounds put more strain on your joints.  Get strong, stay limber. Weak muscles are a common cause of knee injuries. Stretching is important. Include flexibility exercises in your workouts.  Be smart about exercise. If you have osteoarthritis, chronic knee pain or recurring injuries, you may need to change the way you exercise. This does not mean you have to stop  being active. If your knees ache after jogging or playing basketball, consider switching to swimming, water aerobics, or other low-impact activities, at least for a few days a week. Sometimes limiting high-impact activities will provide relief.  Make sure your shoes fit well. Choose footwear that is right for your sport.  Protect your knees. Use the proper gear for knee-sensitive activities. Use kneepads when playing volleyball or laying carpet. Buckle your seat belt every time you drive. Most shattered kneecaps occur in car accidents.  Rest when you are tired. SEEK MEDICAL CARE IF:  You have knee pain  that is continual and does not seem to be getting better.  SEEK IMMEDIATE MEDICAL CARE IF:  Your knee joint feels hot to the touch and you have a high fever. MAKE SURE YOU:   Understand these instructions.  Will watch your condition.  Will get help right away if you are not doing well or get worse. Document Released: 08/03/2007 Document Revised: 12/29/2011 Document Reviewed: 08/03/2007 [facility] Patient Information 2015 [place], [place]. This information is not intended to replace advice given to you by your health care provider. Make sure you discuss any questions you have with your health care provider.

## 2014-12-28 NOTE — ED Provider Notes (Signed)
CSN: [id number]     Arrival date & time 12/28/14  1417 History   First MD Initiated Contact with Patient 12/28/14 1429     Chief Complaint  Patient presents with  . Knee Injury     (Consider location/radiation/quality/duration/timing/severity/associated sxs/prior Treatment) The history is provided by the patient and a parent. No language interpreter was used.  Jose Christensen is a 24 year old male with past medical history of cerebral palsy wheelchair-bound, arthritis, back pain, neurogenic bladder presenting to the emergency department with left knee pain that started this morning at approximately 10:30 AM. As per mother, reported that while at the store there are getting off the elevator and the patient's left foot into the resulting in his knee to twist area patient reports a deep aching sensation to the left knee. Mother reported numerous surgeries when the patient was younger secondary to cerebral palsy. Patient denied numbness, tingling, loss of sensation, swelling. PCP Dr. Mayford Knife Orthopedic Dr. Laurice Record at [facility]  Past Medical History  Diagnosis Date  . Cerebral palsy   . PONV (postoperative nausea and vomiting)   . History of migraine     takes Propranolol daily;last migraine a couple of months ago;mom states he spaces out and looses orientation to time  . Arthritis     hips  . Back pain     scoliosis  . GERD (gastroesophageal reflux disease)     takes Nexium daily   . Constipation     takes Miralax daily  . Spastic neurogenic bladder     takes toviaz daily  . Anxiety     takes Zoloft daily  . Dry eyes     occasionally  . Seasonal allergies     uses Nasonex daily and takes Zyrtec daily  . History of MRSA infection     several yrs ago   Past Surgical History  Procedure Laterality Date  . Orthodpedic surgeries      total of 23-metal plates in both hips  . Arm surgery Bilateral     several times  . Leg surgery Right     titanium rod  . Programable baclofen  pump revision      x2   . Bilateral eye surgery      couple of times  . Myringotomy    . Esophagogastroduodenoscopy    . Tooth extraction N/A 04/03/2014    Procedure: SURGICAL EXTRACTION TEETH #1 AND #16; DIFFICULT EXTRACTION OF IMPACTED TEETH #17 AND #32;  Surgeon: Francene Finders, DDS;  Location: [facility] OR;  Service: Oral Surgery;  Laterality: N/A;   No family history on file. History  Substance Use Topics  . Smoking status: Never Smoker   . Smokeless tobacco: Not on file  . Alcohol Use: No    Review of Systems  Musculoskeletal: Positive for arthralgias (left knee pain ).  Neurological: Negative for weakness and numbness.      Allergies  Adhesive; Compazine; Ditropan; Motrin; Prevacid; Reglan; Tegretol; Erythromycin; and Sulfa antibiotics  Home Medications   Prior to Admission medications   Medication Sig Start Date End Date Taking? Authorizing Provider  acetaminophen (TYLENOL) 500 MG tablet Take 500 mg by mouth every 6 (six) hours as needed for headache.    Historical Provider, MD  cetirizine (ZYRTEC) 10 MG tablet Take 10 mg by mouth daily.    Historical Provider, MD  desmopressin (DDAVP) 0.2 MG tablet Take 0.6 mg by mouth daily.    Historical Provider, MD  esomeprazole (NEXIUM) 40 MG capsule Take 40 mg  by mouth daily at 12 noon.    Historical Provider, MD  fesoterodine (TOVIAZ) 8 MG TB24 tablet Take 8 mg by mouth daily.    Historical Provider, MD  mometasone (NASONEX) 50 MCG/ACT nasal spray Place 2 sprays into the nose daily.    Historical Provider, MD  Olopatadine HCl (PATADAY) 0.2 % SOLN Place 1 drop into both eyes daily.    Historical Provider, MD  polyethylene glycol (MIRALAX / GLYCOLAX) packet Take 17 g by mouth daily.    Historical Provider, MD  PRESCRIPTION MEDICATION Baclofen pump continuous    Historical Provider, MD  propranolol ER (INDERAL LA) 160 MG SR capsule Take 160 mg by mouth daily.    Historical Provider, MD  sertraline (ZOLOFT) 50 MG tablet Take 75 mg  by mouth daily.    Historical Provider, MD  tiZANidine (ZANAFLEX) 2 MG tablet Take 1-2 mg by mouth 2 (two) times daily.  in the morning and  at bedtime    Historical Provider, MD   BP 125/76 mmHg  Pulse 82  Temp(Src) 97.9 F (36.6 C) (Oral)  Resp 18  Ht  (1.626 m)  Wt 164 lb (74.39 kg)  BMI 28.14 kg/m2  SpO2 100% Physical Exam  Constitutional: He is oriented to person, place, and time. He appears well-developed and well-nourished. No distress.  Cardiovascular: Normal rate, regular rhythm and normal heart sounds.  Exam reveals no friction rub.   No murmur heard. Pulses:      Radial pulses are 2+ on the right side, and 2+ on the left side.       Dorsalis pedis pulses are 2+ on the right side, and 2+ on the left side.  Pulmonary/Chest: Effort normal and breath sounds normal. No respiratory distress. He has no wheezes. He has no rales.  Musculoskeletal: He exhibits tenderness. He exhibits no edema.       Left knee: He exhibits normal range of motion and no swelling. Tenderness found. Medial joint line and MCL tenderness noted.  Patient is able to flex and extend the knee - minimal range of motion secondary to cerebral palsy.  Neurological: He is alert and oriented to person, place, and time.  Skin: He is not diaphoretic.  Nursing note and vitals reviewed.   ED Course  Procedures (including critical care time) Labs Review Labs Reviewed - No data to display  Imaging Review Dg Knee Complete 4 Views Left  12/28/2014   CLINICAL DATA:  Ran into wall.  EXAM: LEFT KNEE - COMPLETE 4+ VIEW  COMPARISON:  None.  FINDINGS: Mild diffuse osteopenia. There is no evidence of fracture, dislocation, or joint effusion. There is no evidence of arthropathy or other focal bone abnormality. Soft tissues are unremarkable.  IMPRESSION: Negative.   Electronically Signed   By: Signa Kell M.D.   On: 12/28/2014 15:00     EKG Interpretation None      MDM   Final diagnoses:  Left knee pain     Medications - No data to display  Filed Vitals:   12/28/14 1424 12/28/14 1425  BP:  125/76  Pulse:  82  Temp:  97.9 F (36.6 C)  TempSrc:  Oral  Resp: 18   Height:  (1.626 m)   Weight: 164 lb (74.39 kg)   SpO2:  100%   Plain film of left knee negative for acute osseous injury. Pulses palpable and strong. Range of motion identified to left knee without laxity. Negative deformities identified. Tenderness upon palpation to the medial  aspect of left knee-suspicion to be possible sprain. Negative signs of ischemia. Patient stable, afebrile. Patient not septic appearing. Discharged patient. Referred patient to PCP. Referred patient to orthopedics at [facility]. Discussed with patient to rest and stay hydrated. Discussed with patient to apply ice, elevate and rest. Discussed with patient to closely monitor symptoms and if symptoms are to worsen or change to report back to the ED - strict return instructions given.  Patient agreed to plan of care, understood, all questions answered.   Raymon MuttonMarissa Nicklous Aburto, PA-C 12/28/14 1541  Shon Batonourtney F Horton, MD 12/28/14 [phone]

## 2014-12-28 NOTE — ED Notes (Signed)
Pain in his left knee. He is in a wheel chair. His mom accidentally ran him into a wall causing his ankle to abduct twisting his knee.

## 2015-07-17 IMAGING — CR DG CHEST 2V
2 series · 2 of 2 positions shown · non-contrast
Comparison: None

CLINICAL DATA: Preoperative evaluation for wisdom teeth extraction,
history cerebral palsy

EXAM:
CHEST  2 VIEW

[x chest ap]
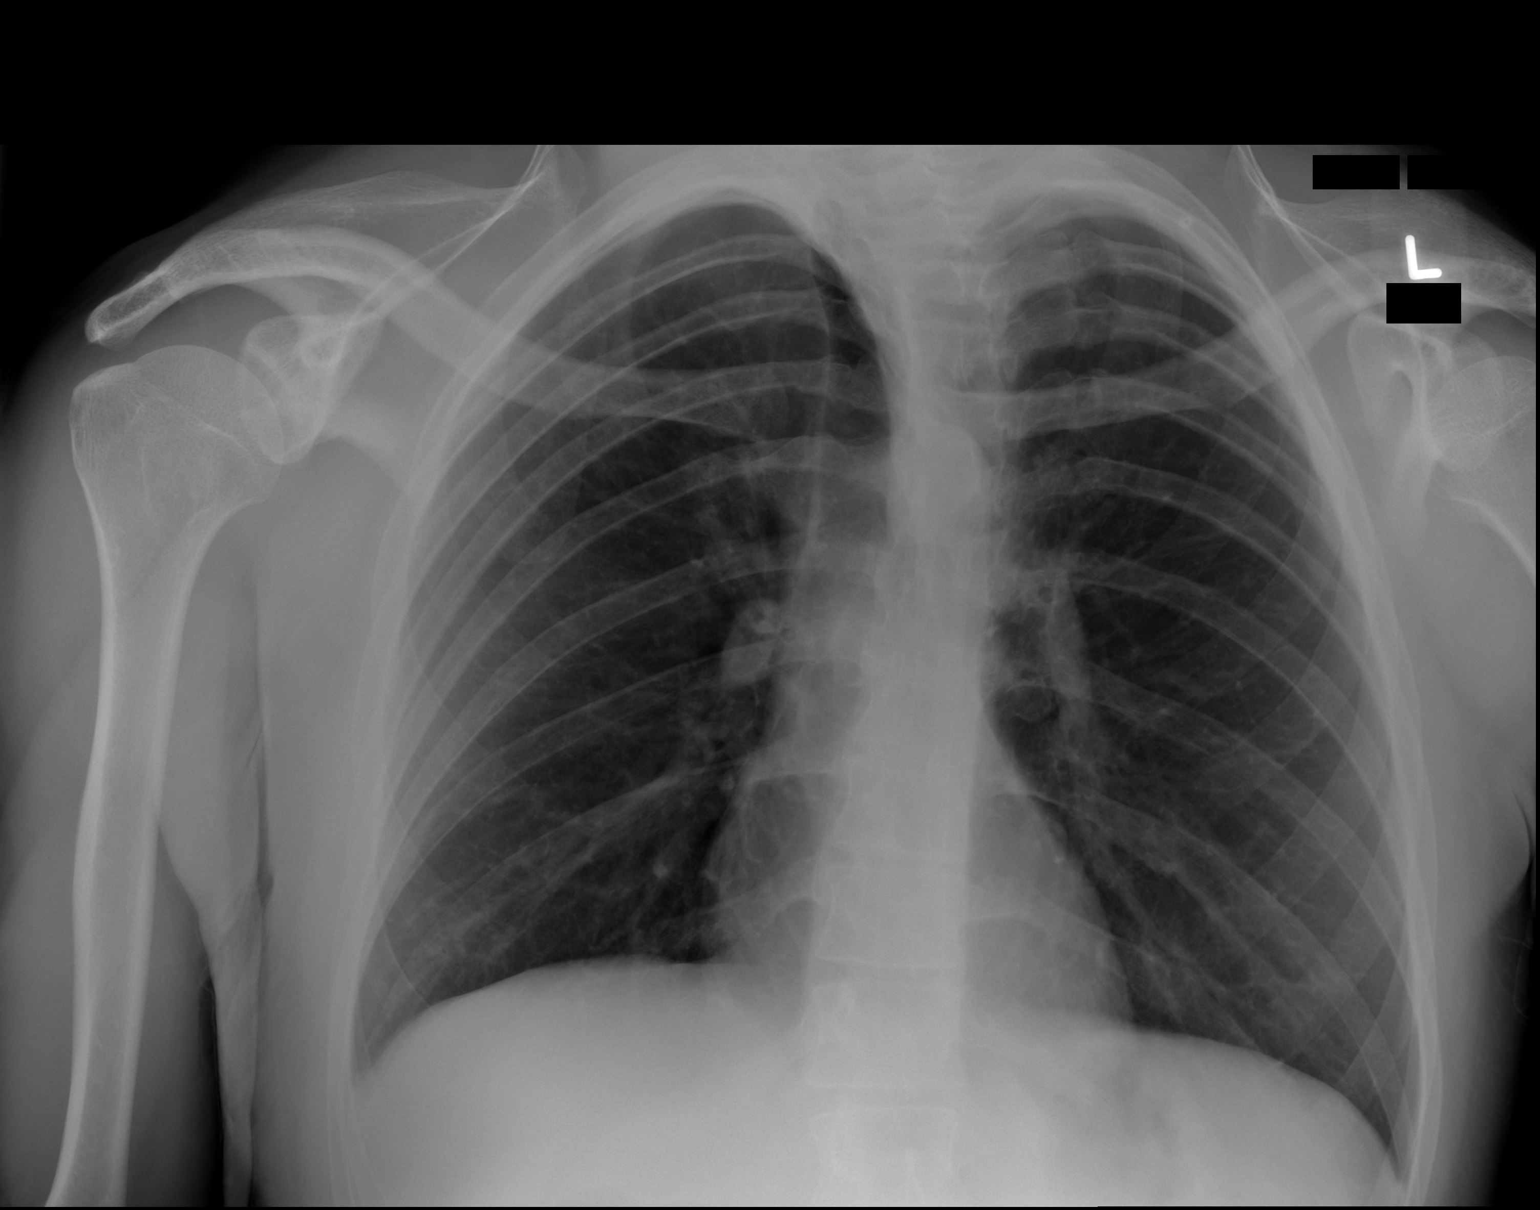

[w chest lat]
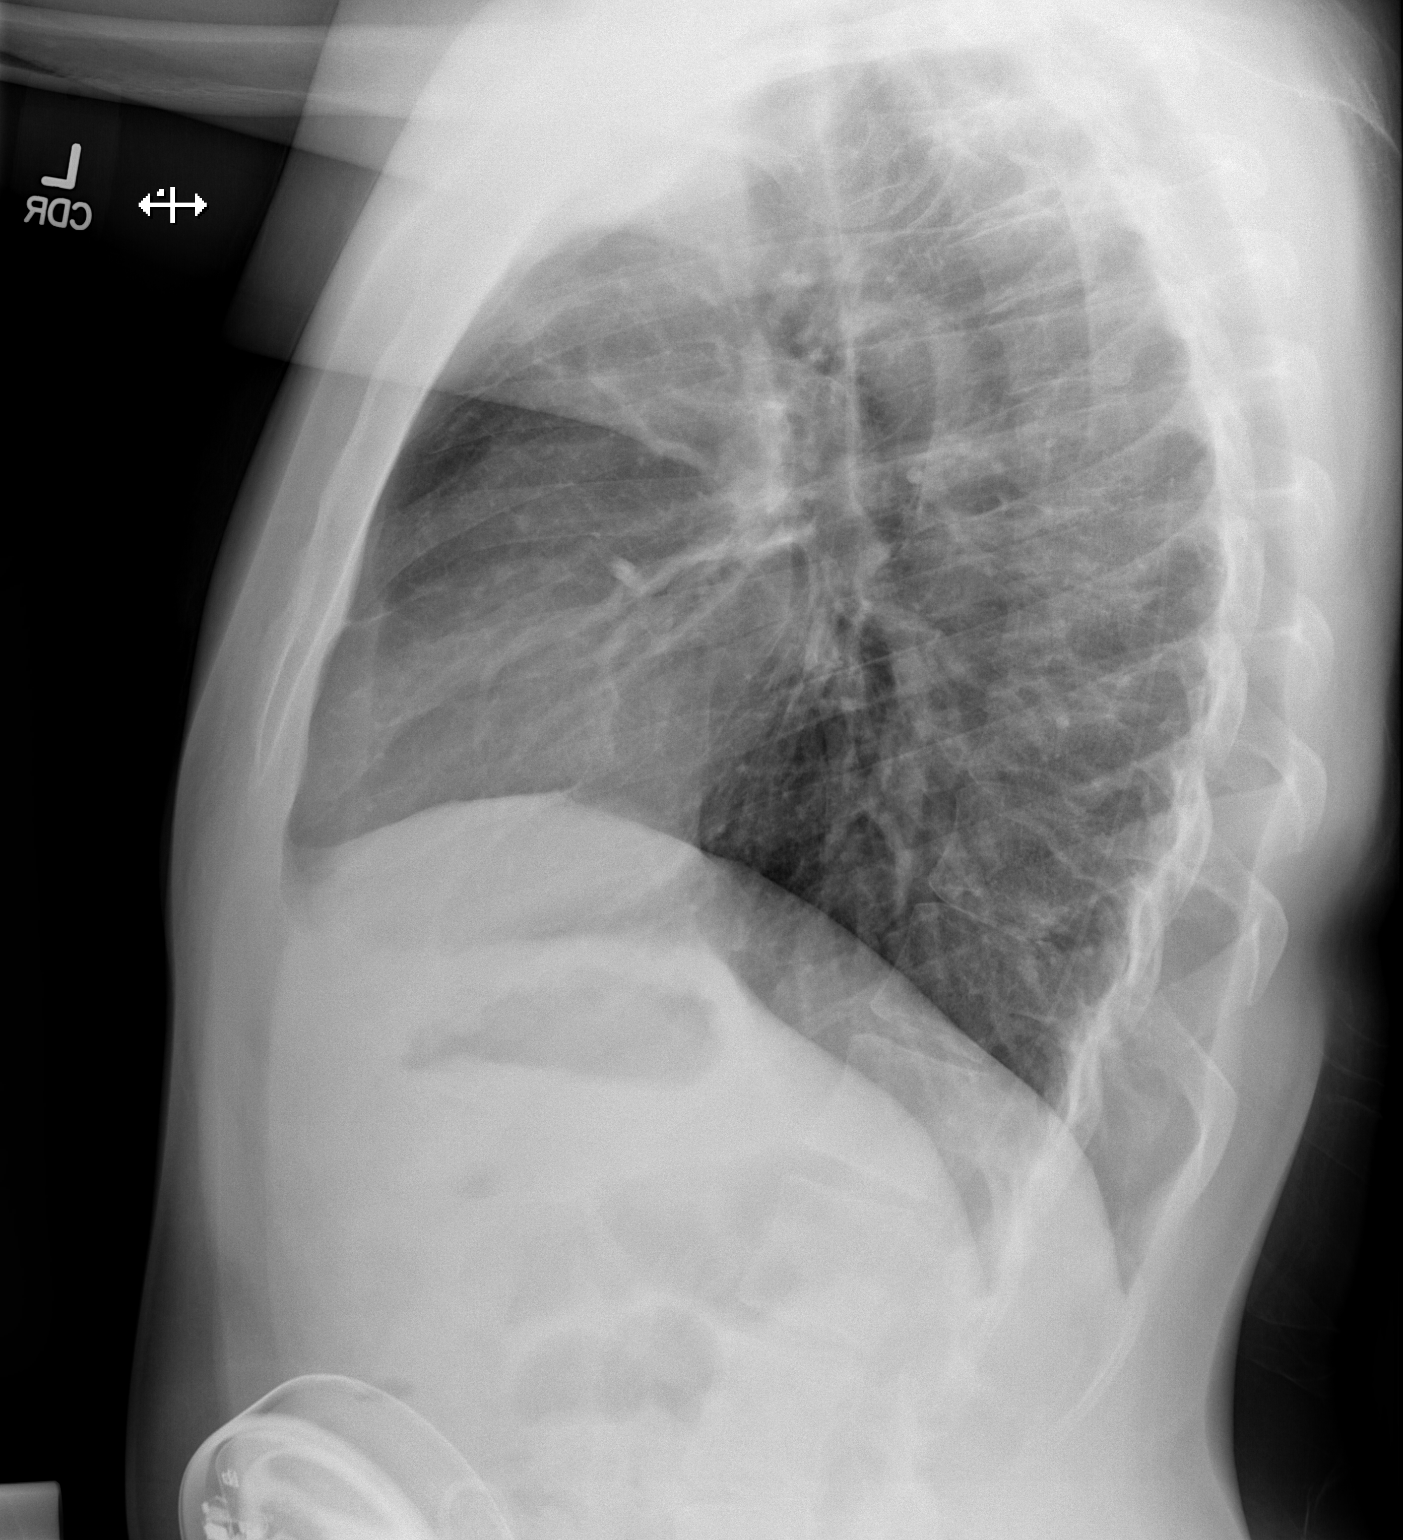

[2 of 2 positions shown; findings below may reference images not displayed]

FINDINGS: Normal heart size, mediastinal contours and pulmonary vascularity.

Lungs clear.

No pleural effusion or pneumothorax.

Levoconvex thoracic scoliosis.
IMPRESSION: No acute abnormalities.

## 2020-04-11 ENCOUNTER — Other Ambulatory Visit: Payer: Self-pay

## 2020-04-11 ENCOUNTER — Encounter: Payer: Self-pay | Admitting: Neurology

## 2020-04-11 ENCOUNTER — Ambulatory Visit (INDEPENDENT_AMBULATORY_CARE_PROVIDER_SITE_OTHER): Payer: Medicare Other | Admitting: Neurology

## 2020-04-11 VITALS — BP 125/86 | HR 84 | Ht 64.0 in | Wt 180.8 lb

## 2020-04-11 DIAGNOSIS — R5382 Chronic fatigue, unspecified: Secondary | ICD-10-CM

## 2020-04-11 DIAGNOSIS — G43009 Migraine without aura, not intractable, without status migrainosus: Secondary | ICD-10-CM

## 2020-04-11 DIAGNOSIS — Z5181 Encounter for therapeutic drug level monitoring: Secondary | ICD-10-CM

## 2020-04-11 HISTORY — DX: Migraine without aura, not intractable, without status migrainosus: G43.009

## 2020-04-11 MED ORDER — PROPRANOLOL HCL ER 160 MG PO CP24: 160.0000 mg | ORAL_CAPSULE | Freq: Every day | ORAL | 3 refills | Status: DC

## 2020-04-11 MED ORDER — RIZATRIPTAN BENZOATE 10 MG PO TABS
10.0000 mg | ORAL_TABLET | Freq: Three times a day (TID) | ORAL | 3 refills | Status: DC | PRN
Start: 2020-04-11 — End: 2021-11-01

## 2020-04-11 MED ORDER — TIZANIDINE HCL 2 MG PO TABS: 2.0000 mg | ORAL_TABLET | Freq: Three times a day (TID) | ORAL | 3 refills | Status: DC

## 2020-04-11 NOTE — Progress Notes (Signed)
Reason for visit: Migraine headache, cerebral palsy  Referring physician: Dr. Hollice Espy Rochford is a 29 y.o. male  History of present illness:  [name] is a 29 year old right-handed white male with a history of cerebral palsy and migraine headaches.  The patient began having events when he was 21 or 29 years old while in high school.  He would have sudden episodes where he would seem to get confused and spaced out.  He would have multiple events during the day.  He was set up for evaluation to the epilepsy unit at [facility], no seizures were found, but they determined that he had migraine headaches.  These episodes of spacing out have gone away, but he still has some headaches that may occur on average once a month.  He may take Tylenol for this with some benefit.  He has taken Nurtec previously which helped.  The headaches usually last about 4 hours but can last all day long and sometimes even up to a week.  The headaches may be in the front or the back of the head.  Lack of sleep may bring on a headache but he does not note any other particular activating factors.  He reports no numbness or weakness with the events.  He denies photophobia but does have some phonophobia at times, he denies any nausea or vomiting.  The patient has a baclofen pump in place, he has significant spasticity of the legs.  He takes tizanidine 2 mg twice daily for a prominent startle reflex.  He is sent to this office for further evaluation.  Past Medical History:  Diagnosis Date  . Anxiety    takes Zoloft daily  . Arthritis    hips  . Back pain    scoliosis  . 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 Constipation    takes Miralax daily  . Dry eyes    occasionally  . GERD (gastroesophageal reflux disease)    takes Nexium daily   . History of migraine    takes Propranolol daily;last migraine a couple of months ago;mom states he spaces out and looses orientation to time  .  History of MRSA infection    several yrs ago  . PONV (postoperative nausea and vomiting)   . Seasonal allergies    uses Nasonex daily and takes Zyrtec daily  . Spastic neurogenic bladder    takes toviaz daily    Past Surgical History:  Procedure Laterality Date  . arm surgery Bilateral    several times  . bilateral eye surgery     couple of times  . ESOPHAGOGASTRODUODENOSCOPY    . LEG SURGERY Right    titanium rod  . MYRINGOTOMY    . orthodpedic surgeries     total of 23-metal plates in both hips  . PROGRAMABLE BACLOFEN PUMP REVISION     x2   . TOOTH EXTRACTION N/A 04/03/2014   Procedure: SURGICAL EXTRACTION TEETH #1 AND #16; DIFFICULT EXTRACTION OF IMPACTED TEETH #17 AND #32;  Surgeon: Francene Finders, DDS;  Location: [facility] OR;  Service: Oral Surgery;  Laterality: N/A;    Family History  Problem Relation Age of Onset  . Multiple sclerosis Mother   . Heart disease Mother   . COPD Mother   . Hypertension Mother     Social history:  reports that he has never smoked. He has never used smokeless tobacco. He reports that he does not drink alcohol and does  not use drugs.  Medications:  Prior to Admission medications   Medication Sig Start Date End Date Taking? Authorizing Provider  acetaminophen (TYLENOL) 500 MG tablet Take 500 mg by mouth every 6 (six) hours as needed for headache.   Yes [provider]  baclofen (LIORESAL) 20 MG tablet Take 20 mg by mouth daily as needed for muscle spasms.   Yes [provider]  cetirizine (ZYRTEC) 10 MG tablet Take 10 mg by mouth daily.   Yes [provider]  desmopressin (DDAVP) 0.2 MG tablet Take 0.6 mg by mouth daily.   Yes [provider]  esomeprazole (NEXIUM) 40 MG capsule Take 40 mg by mouth daily at 12 noon.   Yes [provider]  fesoterodine (TOVIAZ) 8 MG TB24 tablet Take 8 mg by mouth daily.   Yes [provider]  mometasone (NASONEX) 50 MCG/ACT nasal spray Place 2 sprays  into the nose daily.   Yes [provider]  polyethylene glycol (MIRALAX / GLYCOLAX) packet Take 17 g by mouth daily.   Yes [provider]  PRESCRIPTION MEDICATION Baclofen pump continuous   Yes [provider]  propranolol ER (INDERAL LA) 160 MG SR capsule Take 160 mg by mouth daily.   Yes [provider]  sertraline (ZOLOFT) 25 MG tablet Take 25 mg by mouth daily.   Yes [provider]  sertraline (ZOLOFT) 50 MG tablet Take 50 mg by mouth daily.    Yes [provider]  tiZANidine (ZANAFLEX) 2 MG tablet Take 1-2 mg by mouth 2 (two) times daily. 1mg  in the morning and 2mg  at bedtime   Yes [provider]      Allergies  Allergen Reactions  . 5-Alpha Reductase Inhibitors   . Adhesive [Tape] Other (See Comments)    Takes skin off  . Compazine [Prochlorperazine] Other (See Comments)    Altered mental  . Ditropan [Oxybutynin] Other (See Comments)    Beet red  . Motrin [Ibuprofen] Other (See Comments)    Renal bleeding  . Prevacid [Lansoprazole] Other (See Comments)    Internal pump was affected  . Reglan [Metoclopramide] Other (See Comments)    Altered mental  . Tegretol [Carbamazepine] Other (See Comments)    Vision changes  . Erythromycin Rash  . Sulfa Antibiotics Rash    ROS:  Out of a complete 14 system review of symptoms, the patient complains only of the following symptoms, and all other reviewed systems are negative.  Headache Startle reflex Spasticity in legs  Blood pressure 125/86, pulse 84, height 5\' 4"  (1.626 m), weight 180 lb 12.8 oz (82 kg).  Physical Exam  General: The patient is alert and cooperative at the time of the examination.  The patient is moderately obese.  Eyes: Pupils are equal, round, and reactive to light. Discs are flat bilaterally.  Neck: The neck is supple, no carotid bruits are noted.  Respiratory: The respiratory examination is clear.  Cardiovascular: The cardiovascular  examination reveals a regular rate and rhythm, no obvious murmurs or rubs are noted.  Skin: Extremities are without significant edema.  Neurologic Exam  Mental status: The patient is alert and oriented x 3 at the time of the examination. The patient has apparent normal recent and remote memory, with an apparently normal attention span and concentration ability.  Cranial nerves: Facial symmetry is present. There is good sensation of the face to pinprick and soft touch bilaterally. The strength of the facial muscles and the muscles to head turning  and shoulder shrug are normal bilaterally. Speech is well enunciated, no aphasia or dysarthria is noted. Extraocular movements are full.  The patient does have some nystagmus on primary gaze.  Visual fields are full. The tongue is midline, and the patient has symmetric elevation of the soft palate. No obvious hearing deficits are noted.  Motor: The motor testing reveals decreased grip of the left hand as compared to the right, good upper arm strength on both sides.  The patient has significant increased motor tone with both legs, very limited use of the legs.  Sensory: Sensory testing is intact to pinprick, soft touch, vibration sensation, and position sense on all 4 extremities. No evidence of extinction is noted.  Coordination: Cerebellar testing reveals good finger-nose-finger with a right arm, has difficulty with the left, cannot perform heel-to-shin with either leg.  Gait and station: The patient is wheelchair-bound, cannot be ambulated.  Reflexes: Deep tendon reflexes are symmetric and normal bilaterally. Toes are downgoing bilaterally.   Assessment/Plan:  1.  Cerebral palsy, paraparesis  2.  Migraine headache  3.  Baclofen pump placement  The patient is having occasional migraines, he has had a significant improvement in his frequency of events on propranolol, a prescription will be sent in.  We will increase the tizanidine dose to 2 mg 3  times daily for the startle reflex.  He will be placed on Maxalt to take if needed for the headache.  He will follow-up here in 6 months.  Jill Alexanders MD 04/11/2020 2:27 PM  [facility] [address] [place], [place] [address]  Phone [phone] Fax [phone]

## 2020-04-11 NOTE — Patient Instructions (Signed)
Use maxalt if needed for the headache.

## 2020-04-12 LAB — CBC WITH DIFFERENTIAL/PLATELET
Basophils Absolute: 0 10*3/uL (ref 0.0–0.2)
Basos: 0 %
EOS (ABSOLUTE): 0.1 10*3/uL (ref 0.0–0.4)
Eos: 1 %
Hematocrit: 47.4 % (ref 37.5–51.0)
Hemoglobin: 15.8 g/dL (ref 13.0–17.7)
Immature Grans (Abs): 0 10*3/uL (ref 0.0–0.1)
Immature Granulocytes: 0 %
Lymphocytes Absolute: 3 10*3/uL (ref 0.7–3.1)
Lymphs: 45 %
MCH: 29.6 pg (ref 26.6–33.0)
MCHC: 33.3 g/dL (ref 31.5–35.7)
MCV: 89 fL (ref 79–97)
Monocytes Absolute: 0.6 10*3/uL (ref 0.1–0.9)
Monocytes: 10 %
Neutrophils Absolute: 3 10*3/uL (ref 1.4–7.0)
Neutrophils: 44 %
Platelets: 213 10*3/uL (ref 150–450)
RBC: 5.33 x10E6/uL (ref 4.14–5.80)
RDW: 12.5 % (ref 11.6–15.4)
WBC: 6.7 10*3/uL (ref 3.4–10.8)

## 2020-04-12 LAB — COMPREHENSIVE METABOLIC PANEL
ALT: 44 IU/L (ref 0–44)
AST: 22 IU/L (ref 0–40)
Albumin/Globulin Ratio: 1.9 (ref 1.2–2.2)
Albumin: 4.7 g/dL (ref 4.1–5.2)
Alkaline Phosphatase: 66 IU/L (ref 48–121)
BUN/Creatinine Ratio: 12 (ref 9–20)
BUN: 11 mg/dL (ref 6–20)
Bilirubin Total: 0.7 mg/dL (ref 0.0–1.2)
CO2: 28 mmol/L (ref 20–29)
Calcium: 9.8 mg/dL (ref 8.7–10.2)
Chloride: 100 mmol/L (ref 96–106)
Creatinine, Ser: 0.89 mg/dL (ref 0.76–1.27)
GFR calc Af Amer: 133 mL/min/{1.73_m2} (ref >59)
GFR calc non Af Amer: 115 mL/min/{1.73_m2} (ref >59)
Globulin, Total: 2.5 g/dL (ref 1.5–4.5)
Glucose: 73 mg/dL (ref 65–99)
Potassium: 4.6 mmol/L (ref 3.5–5.2)
Sodium: 139 mmol/L (ref 134–144)
Total Protein: 7.2 g/dL (ref 6.0–8.5)

## 2020-04-12 LAB — TSH: TSH: 2.12 u[IU]/mL (ref 0.450–4.500)

## 2020-09-26 ENCOUNTER — Encounter: Payer: Self-pay | Admitting: Neurology

## 2020-09-26 ENCOUNTER — Ambulatory Visit (INDEPENDENT_AMBULATORY_CARE_PROVIDER_SITE_OTHER): Payer: Medicare Other | Admitting: Neurology

## 2020-09-26 VITALS — BP 118/75 | HR 78

## 2020-09-26 DIAGNOSIS — G43009 Migraine without aura, not intractable, without status migrainosus: Secondary | ICD-10-CM

## 2020-09-26 DIAGNOSIS — G809 Cerebral palsy, unspecified: Secondary | ICD-10-CM | POA: Diagnosis not present

## 2020-09-26 MED ORDER — PROPRANOLOL HCL ER 160 MG PO CP24: 160.0000 mg | ORAL_CAPSULE | Freq: Every day | ORAL | 3 refills | Status: DC

## 2020-09-26 MED ORDER — TIZANIDINE HCL 2 MG PO TABS: 2.0000 mg | ORAL_TABLET | Freq: Three times a day (TID) | ORAL | 3 refills | Status: DC

## 2020-09-26 NOTE — Progress Notes (Signed)
PATIENT: Jose Christensen DOB: 1991-03-30  REASON FOR VISIT: follow up HISTORY FROM: patient  HISTORY OF PRESENT ILLNESS: Today 09/26/20 Jose Christensen is a 29 year old male with history of cerebral palsy and migraines.  He has a baclofen pump for spasticity of the legs and is on tizanidine for startle reflex.  Takes propanolol, Maxalt for headaches.  Has been taking tizanidine 2 mg twice a day, versus prescribed 3 times daily.  Has not noted any change to the startle reflex with twice daily dosing.  Headaches remain well controlled, denies migraine headache since last seen in June.  Has not taken the Maxalt, usually takes Tylenol if he feels a headache coming on.  He is not one to complain.  Is in a wheelchair, is able to stand and transfer.  He works part-time as a Clinical cytogeneticist at [employer].  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  Presents today for evaluation accompanied by his mother.  HISTORY 04/11/2020 Dr. Anne Hahn: Jose Christensen is a 29 year old right-handed white male with a history of cerebral palsy and migraine headaches.  The patient began having events when he was 32 or 30 years old while in high school.  He would have sudden episodes where he would seem to get confused and spaced out.  He would have multiple events during the day.  He was set up for evaluation to the epilepsy unit at [facility], no seizures were found, but they determined that he had migraine headaches.  These episodes of spacing out have gone away, but he still has some headaches that may occur on average once a month.  He may take Tylenol for this with some benefit.  He has taken Nurtec previously which helped.  The headaches usually last about 4 hours but can last all day long and sometimes even up to a week.  The headaches may be in the front or the back of the head.  Lack of sleep may bring on a headache but he does not note any other particular activating factors.  He reports no numbness or  weakness with the events.  He denies photophobia but does have some phonophobia at times, he denies any nausea or vomiting.  The patient has a baclofen pump in place, he has significant spasticity of the legs.  He takes tizanidine 2 mg twice daily for a prominent startle reflex.  He is sent to this office for further evaluation.   REVIEW OF SYSTEMS: Out of a complete 14 system review of symptoms, the patient complains only of the following symptoms, and all other reviewed systems are negative.  Headache, startle reflex, spasticity  ALLERGIES: Allergies  Allergen Reactions  . 5-Alpha Reductase Inhibitors   . Adhesive [Tape] Other (See Comments)    Takes skin off  . Compazine [Prochlorperazine] Other (See Comments)    Altered mental  . Ditropan [Oxybutynin] Other (See Comments)    Beet red  . Motrin [Ibuprofen] Other (See Comments)    Renal bleeding  . Prevacid [Lansoprazole] Other (See Comments)    Internal pump was affected  . Reglan [Metoclopramide] Other (See Comments)    Altered mental  . Tegretol [Carbamazepine] Other (See Comments)    Vision changes  . Erythromycin Rash  . Sulfa Antibiotics Rash    HOME MEDICATIONS: Outpatient Medications Prior to Visit  Medication Sig Dispense Refill  . acetaminophen (TYLENOL) 500 MG tablet Take 500 mg by mouth every 6 (six) hours as needed for headache.    Marland Kitchen  baclofen (LIORESAL) 20 MG tablet Take 20 mg by mouth daily as needed for muscle spasms.    . cetirizine (ZYRTEC) 10 MG tablet Take 10 mg by mouth daily.    Marland Kitchen desmopressin (DDAVP) 0.2 MG tablet Take 0.6 mg by mouth daily.    Marland Kitchen esomeprazole (NEXIUM) 40 MG capsule Take 40 mg by mouth daily at 12 noon.    . fesoterodine (TOVIAZ) 8 MG TB24 tablet Take 8 mg by mouth daily.    . mometasone (NASONEX) 50 MCG/ACT nasal spray Place 2 sprays into the nose daily.    . polyethylene glycol (MIRALAX / GLYCOLAX) packet Take 17 g by mouth daily.    Marland Kitchen PRESCRIPTION MEDICATION Baclofen pump continuous     . rizatriptan (MAXALT) 10 MG tablet Take 1 tablet (10 mg total) by mouth 3 (three) times daily as needed for migraine. May repeat in 2 hours if needed 10 tablet 3  . sertraline (ZOLOFT) 25 MG tablet Take 25 mg by mouth daily.    . sertraline (ZOLOFT) 50 MG tablet Take 50 mg by mouth daily.     . propranolol ER (INDERAL LA) 160 MG SR capsule Take 1 capsule (160 mg total) by mouth daily. 90 capsule 3  . tiZANidine (ZANAFLEX) 2 MG tablet Take 1 tablet (2 mg total) by mouth 3 (three) times daily. 1mg  in the morning and 2mg  at bedtime 270 tablet 3   No facility-administered medications prior to visit.    PAST MEDICAL HISTORY: Past Medical History:  Diagnosis Date  . Anxiety    takes Zoloft daily  . Arthritis    hips  . Back pain    scoliosis  . 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 Common migraine 04/11/2020  . Constipation    takes Miralax daily  . Dry eyes    occasionally  . GERD (gastroesophageal reflux disease)    takes Nexium daily   . History of migraine    takes Propranolol daily;last migraine a couple of months ago;mom states he spaces out and looses orientation to time  . History of MRSA infection    several yrs ago  . PONV (postoperative nausea and vomiting)   . Seasonal allergies    uses Nasonex daily and takes Zyrtec daily  . Spastic neurogenic bladder    takes toviaz daily    PAST SURGICAL HISTORY: Past Surgical History:  Procedure Laterality Date  . arm surgery Bilateral    several times  . bilateral eye surgery     couple of times  . ESOPHAGOGASTRODUODENOSCOPY    . LEG SURGERY Right    titanium rod  . MYRINGOTOMY    . orthodpedic surgeries     total of 23-metal plates in both hips  . PROGRAMABLE BACLOFEN PUMP REVISION     x2   . TOOTH EXTRACTION N/A 04/03/2014   Procedure: SURGICAL EXTRACTION TEETH #1 AND #16; DIFFICULT EXTRACTION OF IMPACTED TEETH #17 AND #32;  Surgeon: 04/13/2020, DDS;  Location: [facility] OR;  Service:  Oral Surgery;  Laterality: N/A;    FAMILY HISTORY: Family History  Problem Relation Age of Onset  . Multiple sclerosis Mother   . Heart disease Mother   . COPD Mother   . Hypertension Mother     SOCIAL HISTORY: Social History   Socioeconomic History  . Marital status: Single    Spouse name: Not on file  . Number of children: Not on file  . Years of education: Not on  file  . Highest education level: Not on file  Occupational History  . Not on file  Tobacco Use  . Smoking status: Never Smoker  . Smokeless tobacco: Never Used  Vaping Use  . Vaping Use: Never used  Substance and Sexual Activity  . Alcohol use: No  . Drug use: No  . Sexual activity: Never  Other Topics Concern  . Not on file  Social History Narrative  . Not on file   Social Determinants of Health   Financial Resource Strain:   . Difficulty of Paying Living Expenses: Not on file  Food Insecurity:   . Worried About Programme researcher, broadcasting/film/videounning Out of Food in the Last Year: Not on file  . Ran Out of Food in the Last Year: Not on file  Transportation Needs:   . Lack of Transportation (Medical): Not on file  . Lack of Transportation (Non-Medical): Not on file  Physical Activity:   . Days of Exercise per Week: Not on file  . Minutes of Exercise per Session: Not on file  Stress:   . Feeling of Stress : Not on file  Social Connections:   . Frequency of Communication with Friends and Family: Not on file  . Frequency of Social Gatherings with Friends and Family: Not on file  . Attends Religious Services: Not on file  . Active Member of Clubs or Organizations: Not on file  . Attends BankerClub or Organization Meetings: Not on file  . Marital Status: Not on file  Intimate Partner Violence:   . Fear of Current or Ex-Partner: Not on file  . Emotionally Abused: Not on file  . Physically Abused: Not on file  . Sexually Abused: Not on file   PHYSICAL EXAM  Vitals:   09/26/20 1321  BP: 118/75  Pulse: 78   There is no height or  weight on file to calculate BMI.  Generalized: Well developed, in no acute distress  Neurological examination  Mentation: Alert oriented to time, place, history taking. Follows all commands speech and language fluent Cranial nerve II-XII: Pupils were equal round reactive to light. Extraocular movements were full, visual field were full on confrontational test. Facial sensation and strength were normal.  Head turning and shoulder shrug  were normal and symmetric. Motor: left hand 1-2 fingers in flexion, strength is fairly well-maintained to both upper extremities, significant increased motor tone to both legs, limited use of the legs Sensory: Sensory testing is intact to soft touch on all 4 extremities. No evidence of extinction is noted.  Coordination: Good finger-nose-finger with the right arm, difficulty with the left, cannot perform heel-to-shin Gait and station: Wheelchair-bound, cannot be ambulated Reflexes: Deep tendon reflexes are symmetric and normal bilaterally.   DIAGNOSTIC DATA (LABS, IMAGING, TESTING) - I reviewed patient records, labs, notes, testing and imaging myself where available.  Lab Results  Component Value Date   WBC 6.7 04/11/2020   HGB 15.8 04/11/2020   HCT 47.4 04/11/2020   MCV 89 04/11/2020   PLT 213 04/11/2020      Component Value Date/Time   NA 139 04/11/2020 1501   K 4.6 04/11/2020 1501   CL 100 04/11/2020 1501   CO2 28 04/11/2020 1501   GLUCOSE 73 04/11/2020 1501   GLUCOSE 78 03/27/2014 1344   BUN 11 04/11/2020 1501   CREATININE 0.89 04/11/2020 1501   CALCIUM 9.8 04/11/2020 1501   PROT 7.2 04/11/2020 1501   ALBUMIN 4.7 04/11/2020 1501   AST 22 04/11/2020 1501   ALT 44 04/11/2020  1501   ALKPHOS 66 04/11/2020 1501   BILITOT 0.7 04/11/2020 1501   GFRNONAA 115 04/11/2020 1501   GFRAA 133 04/11/2020 1501   No results found for: CHOL, HDL, LDLCALC, LDLDIRECT, TRIG, CHOLHDL No results found for: [id number] No results found for: VITAMINB12 Lab Results   Component Value Date   TSH 2.120 04/11/2020   ASSESSMENT AND PLAN 29 y.o. year old male  has a past medical history of Anxiety, Arthritis, Back pain, Cerebral palsy (HCC), Common migraine (04/11/2020), Constipation, Dry eyes, GERD (gastroesophageal reflux disease), History of migraine, History of MRSA infection, PONV (postoperative nausea and vomiting), Seasonal allergies, and Spastic neurogenic bladder. here with:  1.  Cerebral palsy, paraparesis 2.  Migraine headache 3.  Baclofen pump placement  His migraines remain under excellent control, he will continue propanolol.  He will try taking tizanidine 2 mg 3 times a day for startle reflex.  He has Maxalt to take if needed for acute migraine.  He has baclofen pump in place, that is managed by [facility] neurology.  He will follow-up in 6 months or sooner if needed.  I spent 20 minutes of face-to-face and non-face-to-face time with patient.  This included previsit chart review, lab review, study review, order entry, electronic health record documentation, patient education.  Margie Ege, AGNP-C, DNP 09/26/2020, 2:35 PM [facility] [address] [place], [place] [address] [phone]

## 2020-09-26 NOTE — Patient Instructions (Signed)
Try taking tizanidine 2 mg 3 times daily Continue propranolol for headaches  See you back in 6 months

## 2020-09-26 NOTE — Progress Notes (Signed)
I have read the note, and I agree with the clinical assessment and plan.  Antigone Crowell K Edna Grover   

## 2021-03-27 ENCOUNTER — Ambulatory Visit: Payer: Medicare Other | Admitting: Neurology

## 2021-03-27 ENCOUNTER — Telehealth: Payer: Self-pay | Admitting: Neurology

## 2021-03-27 NOTE — Telephone Encounter (Signed)
Noted  

## 2021-03-27 NOTE — Telephone Encounter (Signed)
Pt called, cancelling appt due to mother is sick and cannot bring him.

## 2021-03-27 NOTE — Progress Notes (Deleted)
PATIENT: Jose Christensen DOB: November 10, 1990  REASON FOR VISIT: follow up HISTORY FROM: patient  HISTORY OF PRESENT ILLNESS: Today 03/27/21 Jose Christensen is a 30 year old male with history of cerebral palsy and migraines.  Has a baclofen pump for spasticity of the legs, on tizanidine for startle reflex.  Headaches well controlled with propanolol, Maxalt.  Update 09/26/2020 SS: Jose Christensen is a 30 year old male with history of cerebral palsy and migraines.  He has a baclofen pump for spasticity of the legs and is on tizanidine for startle reflex.  Takes propanolol, Maxalt for headaches.  Has been taking tizanidine 2 mg twice a day, versus prescribed 3 times daily.  Has not noted any change to the startle reflex with twice daily dosing.  Headaches remain well controlled, denies migraine headache since last seen in June.  Has not taken the Maxalt, usually takes Tylenol if he feels a headache coming on.  He is not one to complain.  Is in a wheelchair, is able to stand and transfer.  He works part-time as a Clinical cytogeneticist at [employer].  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  Presents today for evaluation accompanied by his mother.  HISTORY 04/11/2020 Dr. Anne Hahn: Jose Christensen is a 30 year old right-handed white male with a history of cerebral palsy and migraine headaches.  The patient began having events when he was 28 or 30 years old while in high school.  He would have sudden episodes where he would seem to get confused and spaced out.  He would have multiple events during the day.  He was set up for evaluation to the epilepsy unit at [facility], no seizures were found, but they determined that he had migraine headaches.  These episodes of spacing out have gone away, but he still has some headaches that may occur on average once a month.  He may take Tylenol for this with some benefit.  He has taken Nurtec previously which helped.  The headaches usually last about 4 hours but can  last all day long and sometimes even up to a week.  The headaches may be in the front or the back of the head.  Lack of sleep may bring on a headache but he does not note any other particular activating factors.  He reports no numbness or weakness with the events.  He denies photophobia but does have some phonophobia at times, he denies any nausea or vomiting.  The patient has a baclofen pump in place, he has significant spasticity of the legs.  He takes tizanidine 2 mg twice daily for a prominent startle reflex.  He is sent to this office for further evaluation.   REVIEW OF SYSTEMS: Out of a complete 14 system review of symptoms, the patient complains only of the following symptoms, and all other reviewed systems are negative.  Headache, startle reflex, spasticity  ALLERGIES: Allergies  Allergen Reactions  . 5-Alpha Reductase Inhibitors   . Adhesive [Tape] Other (See Comments)    Takes skin off  . Compazine [Prochlorperazine] Other (See Comments)    Altered mental  . Ditropan [Oxybutynin] Other (See Comments)    Beet red  . Motrin [Ibuprofen] Other (See Comments)    Renal bleeding  . Prevacid [Lansoprazole] Other (See Comments)    Internal pump was affected  . Reglan [Metoclopramide] Other (See Comments)    Altered mental  . Tegretol [Carbamazepine] Other (See Comments)    Vision changes  . Erythromycin Rash  .  Sulfa Antibiotics Rash    HOME MEDICATIONS: Outpatient Medications Prior to Visit  Medication Sig Dispense Refill  . acetaminophen (TYLENOL) 500 MG tablet Take 500 mg by mouth every 6 (six) hours as needed for headache.    . baclofen (LIORESAL) 20 MG tablet Take 20 mg by mouth daily as needed for muscle spasms.    . cetirizine (ZYRTEC) 10 MG tablet Take 10 mg by mouth daily.    Marland Kitchen desmopressin (DDAVP) 0.2 MG tablet Take 0.6 mg by mouth daily.    Marland Kitchen esomeprazole (NEXIUM) 40 MG capsule Take 40 mg by mouth daily at 12 noon.    . fesoterodine (TOVIAZ) 8 MG TB24 tablet Take 8 mg  by mouth daily.    . mometasone (NASONEX) 50 MCG/ACT nasal spray Place 2 sprays into the nose daily.    . polyethylene glycol (MIRALAX / GLYCOLAX) packet Take 17 g by mouth daily.    Marland Kitchen PRESCRIPTION MEDICATION Baclofen pump continuous    . propranolol ER (INDERAL LA) 160 MG SR capsule Take 1 capsule (160 mg total) by mouth daily. 90 capsule 3  . rizatriptan (MAXALT) 10 MG tablet Take 1 tablet (10 mg total) by mouth 3 (three) times daily as needed for migraine. May repeat in 2 hours if needed 10 tablet 3  . sertraline (ZOLOFT) 25 MG tablet Take 25 mg by mouth daily.    . sertraline (ZOLOFT) 50 MG tablet Take 50 mg by mouth daily.     Marland Kitchen tiZANidine (ZANAFLEX) 2 MG tablet Take 1 tablet (2 mg total) by mouth 3 (three) times daily. 270 tablet 3   No facility-administered medications prior to visit.    PAST MEDICAL HISTORY: Past Medical History:  Diagnosis Date  . Anxiety    takes Zoloft daily  . Arthritis    hips  . Back pain    scoliosis  . 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 Common migraine 04/11/2020  . Constipation    takes Miralax daily  . Dry eyes    occasionally  . GERD (gastroesophageal reflux disease)    takes Nexium daily   . History of migraine    takes Propranolol daily;last migraine a couple of months ago;mom states he spaces out and looses orientation to time  . History of MRSA infection    several yrs ago  . PONV (postoperative nausea and vomiting)   . Seasonal allergies    uses Nasonex daily and takes Zyrtec daily  . Spastic neurogenic bladder    takes toviaz daily    PAST SURGICAL HISTORY: Past Surgical History:  Procedure Laterality Date  . arm surgery Bilateral    several times  . bilateral eye surgery     couple of times  . ESOPHAGOGASTRODUODENOSCOPY    . LEG SURGERY Right    titanium rod  . MYRINGOTOMY    . orthodpedic surgeries     total of 23-metal plates in both hips  . PROGRAMABLE BACLOFEN PUMP REVISION     x2   .  TOOTH EXTRACTION N/A 04/03/2014   Procedure: SURGICAL EXTRACTION TEETH #1 AND #16; DIFFICULT EXTRACTION OF IMPACTED TEETH #17 AND #32;  Surgeon: Francene Finders, DDS;  Location: [facility] OR;  Service: Oral Surgery;  Laterality: N/A;    FAMILY HISTORY: Family History  Problem Relation Age of Onset  . Multiple sclerosis Mother   . Heart disease Mother   . COPD Mother   . Hypertension Mother     SOCIAL HISTORY:  Social History   Socioeconomic History  . Marital status: Single    Spouse name: Not on file  . Number of children: Not on file  . Years of education: Not on file  . Highest education level: Not on file  Occupational History  . Not on file  Tobacco Use  . Smoking status: Never Smoker  . Smokeless tobacco: Never Used  Vaping Use  . Vaping Use: Never used  Substance and Sexual Activity  . Alcohol use: No  . Drug use: No  . Sexual activity: Never  Other Topics Concern  . Not on file  Social History Narrative  . Not on file   Social Determinants of Health   Financial Resource Strain: Not on file  Food Insecurity: Not on file  Transportation Needs: Not on file  Physical Activity: Not on file  Stress: Not on file  Social Connections: Not on file  Intimate Partner Violence: Not on file   PHYSICAL EXAM  There were no vitals filed for this visit. There is no height or weight on file to calculate BMI.  Generalized: Well developed, in no acute distress  Neurological examination  Mentation: Alert oriented to time, place, history taking. Follows all commands speech and language fluent Cranial nerve II-XII: Pupils were equal round reactive to light. Extraocular movements were full, visual field were full on confrontational test. Facial sensation and strength were normal.  Head turning and shoulder shrug  were normal and symmetric. Motor: left hand 1-2 fingers in flexion, strength is fairly well-maintained to both upper extremities, significant increased motor tone to both  legs, limited use of the legs Sensory: Sensory testing is intact to soft touch on all 4 extremities. No evidence of extinction is noted.  Coordination: Good finger-nose-finger with the right arm, difficulty with the left, cannot perform heel-to-shin Gait and station: Wheelchair-bound, cannot be ambulated Reflexes: Deep tendon reflexes are symmetric and normal bilaterally.   DIAGNOSTIC DATA (LABS, IMAGING, TESTING) - I reviewed patient records, labs, notes, testing and imaging myself where available.  Lab Results  Component Value Date   WBC 6.7 04/11/2020   HGB 15.8 04/11/2020   HCT 47.4 04/11/2020   MCV 89 04/11/2020   PLT 213 04/11/2020      Component Value Date/Time   NA 139 04/11/2020 1501   K 4.6 04/11/2020 1501   CL 100 04/11/2020 1501   CO2 28 04/11/2020 1501   GLUCOSE 73 04/11/2020 1501   GLUCOSE 78 03/27/2014 1344   BUN 11 04/11/2020 1501   CREATININE 0.89 04/11/2020 1501   CALCIUM 9.8 04/11/2020 1501   PROT 7.2 04/11/2020 1501   ALBUMIN 4.7 04/11/2020 1501   AST 22 04/11/2020 1501   ALT 44 04/11/2020 1501   ALKPHOS 66 04/11/2020 1501   BILITOT 0.7 04/11/2020 1501   GFRNONAA 115 04/11/2020 1501   GFRAA 133 04/11/2020 1501   No results found for: CHOL, HDL, LDLCALC, LDLDIRECT, TRIG, CHOLHDL No results found for: [id number] No results found for: VITAMINB12 Lab Results  Component Value Date   TSH 2.120 04/11/2020   ASSESSMENT AND PLAN 30 y.o. year old male  has a past medical history of Anxiety, Arthritis, Back pain, Cerebral palsy (HCC), Common migraine (04/11/2020), Constipation, Dry eyes, GERD (gastroesophageal reflux disease), History of migraine, History of MRSA infection, PONV (postoperative nausea and vomiting), Seasonal allergies, and Spastic neurogenic bladder. here with:  1.  Cerebral palsy, paraparesis 2.  Migraine headache 3.  Baclofen pump placement  His migraines remain under excellent control,  he will continue propanolol.  He will try taking  tizanidine 2 mg 3 times a day for startle reflex.  He has Maxalt to take if needed for acute migraine.  He has baclofen pump in place, that is managed by [facility] neurology.  He will follow-up in 6 months or sooner if needed.  I spent 20 minutes of face-to-face and non-face-to-face time with patient.  This included previsit chart review, lab review, study review, order entry, electronic health record documentation, patient education.  Margie Ege, AGNP-C, DNP 03/27/2021, 5:33 AM [facility] [address], [address] [place], [place] [address] [phone]

## 2021-04-03 ENCOUNTER — Ambulatory Visit: Payer: Medicare Other | Admitting: Neurology

## 2021-08-30 ENCOUNTER — Other Ambulatory Visit: Payer: Self-pay | Admitting: Neurology

## 2021-10-03 ENCOUNTER — Ambulatory Visit: Payer: Medicare Other | Admitting: Neurology

## 2021-10-17 ENCOUNTER — Ambulatory Visit (INDEPENDENT_AMBULATORY_CARE_PROVIDER_SITE_OTHER): Payer: Medicare Other | Admitting: Neurology

## 2021-10-17 ENCOUNTER — Encounter: Payer: Self-pay | Admitting: Neurology

## 2021-10-17 VITALS — BP 127/89 | HR 81 | Ht 64.0 in | Wt 180.0 lb

## 2021-10-17 DIAGNOSIS — G43009 Migraine without aura, not intractable, without status migrainosus: Secondary | ICD-10-CM | POA: Diagnosis not present

## 2021-10-17 MED ORDER — TIZANIDINE HCL 2 MG PO TABS: 2.0000 mg | ORAL_TABLET | Freq: Three times a day (TID) | ORAL | 3 refills | Status: DC

## 2021-10-17 MED ORDER — PROPRANOLOL HCL ER 160 MG PO CP24: 160.0000 mg | ORAL_CAPSULE | Freq: Every day | ORAL | 3 refills | Status: DC

## 2021-10-17 NOTE — Patient Instructions (Signed)
Will continue current medications Let us know if headaches worsen  See you back in 1 year

## 2021-10-17 NOTE — Progress Notes (Signed)
PATIENT: Jose Christensen DOB: 05/29/1991  REASON FOR VISIT: follow up HISTORY FROM: patient Primary Neurologist: Dr. Lucia Gaskins since Dr. Anne Hahn has retired  HISTORY OF PRESENT ILLNESS: Today 10/17/21 Jose Christensen here today for follow-up with his mother. Headaches are well controlled on propranolol. Had 1 headache yesterday, but otherwise has been months. Hasn't had to use the Maxalt. Takes Tylenol. 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 Sees PA at [facility] for baclofen pump refill, doesn't want to switch all neurology care there. Is overall doing well. Still works as Clinical cytogeneticist at [employer].    Update 09/26/2020 SS: Mr. Talent is a 30 year old male with history of cerebral palsy and migraines.  He has a baclofen pump for spasticity of the legs and is on tizanidine for startle reflex.  Takes propanolol, Maxalt for headaches.  Has been taking tizanidine 2 mg twice a day, versus prescribed 3 times daily.  Has not noted any change to the startle reflex with twice daily dosing.  Headaches remain well controlled, denies migraine headache since last seen in June.  Has not taken the Maxalt, usually takes Tylenol if he feels a headache coming on.  He is not one to complain.  Is in a wheelchair, is able to stand and transfer.  He works part-time as a Clinical cytogeneticist at [employer].  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  Presents today for evaluation accompanied by his mother.  HISTORY 04/11/2020 Dr. Anne Hahn: Mr. Balingit is a 30 year old right-handed white male with a history of cerebral palsy and migraine headaches.  The patient began having events when he was 42 or 30 years old while in high school.  He would have sudden episodes where he would seem to get confused and spaced out.  He would have multiple events during the day.  He was set up for evaluation to the epilepsy unit at [facility], no seizures were found, but they  determined that he had migraine headaches.  These episodes of spacing out have gone away, but he still has some headaches that may occur on average once a month.  He may take Tylenol for this with some benefit.  He has taken Nurtec previously which helped.  The headaches usually last about 4 hours but can last all day long and sometimes even up to a week.  The headaches may be in the front or the back of the head.  Lack of sleep may bring on a headache but he does not note any other particular activating factors.  He reports no numbness or weakness with the events.  He denies photophobia but does have some phonophobia at times, he denies any nausea or vomiting.  The patient has a baclofen pump in place, he has significant spasticity of the legs.  He takes tizanidine 2 mg twice daily for a prominent startle reflex.  He is sent to this office for further evaluation.   REVIEW OF SYSTEMS: Out of a complete 14 system review of symptoms, the patient complains only of the following symptoms, and all other reviewed systems are negative.  See HPI  ALLERGIES: Allergies  Allergen Reactions   5-Alpha Reductase Inhibitors    Adhesive [Tape] Other (See Comments)    Takes skin off   Compazine [Prochlorperazine] Other (See Comments)    Altered mental   Ditropan [Oxybutynin] Other (See Comments)    Beet red   Motrin [Ibuprofen] Other (See Comments)  Renal bleeding   Prevacid [Lansoprazole] Other (See Comments)    Internal pump was affected   Reglan [Metoclopramide] Other (See Comments)    Altered mental   Tegretol [Carbamazepine] Other (See Comments)    Vision changes   Erythromycin Rash   Sulfa Antibiotics Rash    HOME MEDICATIONS: Outpatient Medications Prior to Visit  Medication Sig Dispense Refill   acetaminophen (TYLENOL) 500 MG tablet Take 500 mg by mouth every 6 (six) hours as needed for headache.     baclofen (LIORESAL) 20 MG tablet Take 20 mg by mouth daily as needed for muscle spasms.      cetirizine (ZYRTEC) 10 MG tablet Take 10 mg by mouth daily.     desmopressin (DDAVP) 0.2 MG tablet Take 0.6 mg by mouth daily.     esomeprazole (NEXIUM) 40 MG capsule Take 40 mg by mouth daily at 12 noon.     fesoterodine (TOVIAZ) 8 MG TB24 tablet Take 8 mg by mouth daily.     mometasone (NASONEX) 50 MCG/ACT nasal spray Place 2 sprays into the nose daily.     polyethylene glycol (MIRALAX / GLYCOLAX) packet Take 17 g by mouth daily.     PRESCRIPTION MEDICATION Baclofen pump continuous     sertraline (ZOLOFT) 25 MG tablet Take 25 mg by mouth daily.     sertraline (ZOLOFT) 50 MG tablet Take 50 mg by mouth daily.      propranolol ER (INDERAL LA) 160 MG SR capsule Take 1 capsule (160 mg total) by mouth daily. 90 capsule 3   tiZANidine (ZANAFLEX) 2 MG tablet Take 1 tablet (2 mg total) by mouth 3 (three) times daily. 270 tablet 3   rizatriptan (MAXALT) 10 MG tablet Take 1 tablet (10 mg total) by mouth 3 (three) times daily as needed for migraine. May repeat in 2 hours if needed (Patient not taking: Reported on 10/17/2021) 10 tablet 3   No facility-administered medications prior to visit.    PAST MEDICAL HISTORY: Past Medical History:  Diagnosis Date   Anxiety    takes Zoloft daily   Arthritis    hips   Back pain    scoliosis   Cerebral palsy (HCC)    spastic quadriplegic cerebral palsy   Common migraine 04/11/2020   Constipation    takes Miralax daily   Dry eyes    occasionally   GERD (gastroesophageal reflux disease)    takes Nexium daily    History of migraine    takes Propranolol daily;last migraine a couple of months ago;mom states he spaces out and looses orientation to time   History of MRSA infection    several yrs ago   PONV (postoperative nausea and vomiting)    Seasonal allergies    uses Nasonex daily and takes Zyrtec daily   Spastic neurogenic bladder    takes toviaz daily    PAST SURGICAL HISTORY: Past Surgical History:  Procedure Laterality Date   arm surgery  Bilateral    several times   bilateral eye surgery     couple of times   ESOPHAGOGASTRODUODENOSCOPY     LEG SURGERY Right    titanium rod   MYRINGOTOMY     orthodpedic surgeries     total of 23-metal plates in both hips   PROGRAMABLE BACLOFEN PUMP REVISION     x2    TOOTH EXTRACTION N/A 04/03/2014   Procedure: SURGICAL EXTRACTION TEETH #1 AND #16; DIFFICULT EXTRACTION OF IMPACTED TEETH #17 AND #32;  Surgeon: Francene Finders,  DDS;  Location: [facility] OR;  Service: Oral Surgery;  Laterality: N/A;    FAMILY HISTORY: Family History  Problem Relation Age of Onset   Multiple sclerosis Mother    Heart disease Mother    COPD Mother    Hypertension Mother     SOCIAL HISTORY: Social History   Socioeconomic History   Marital status: Single    Spouse name: Not on file   Number of children: Not on file   Years of education: Not on file   Highest education level: Not on file  Occupational History   Not on file  Tobacco Use   Smoking status: Never   Smokeless tobacco: Never  Vaping Use   Vaping Use: Never used  Substance and Sexual Activity   Alcohol use: No   Drug use: No   Sexual activity: Never  Other Topics Concern   Not on file  Social History Narrative   Not on file   Social Determinants of Health   Financial Resource Strain: Not on file  Food Insecurity: Not on file  Transportation Needs: Not on file  Physical Activity: Not on file  Stress: Not on file  Social Connections: Not on file  Intimate Partner Violence: Not on file   PHYSICAL EXAM  Vitals:   10/17/21 1443  BP: 127/89  Pulse: 81  Weight: 180 lb (81.6 kg)  Height: 5\' 4"  (1.626 m)    Body mass index is 30.9 kg/m.  Generalized: Well developed, in no acute distress  Neurological examination  Mentation: Alert oriented to time, place, history taking. Follows all commands speech and language fluent Cranial nerve II-XII: Pupils were equal round reactive to light. Extraocular movements were full,  visual field were full on confrontational test. Facial sensation and strength were normal.  Head turning and shoulder shrug were normal and symmetric. Motor: left hand in flexion, strength is fairly well-maintained to both upper extremities, significant increased motor tone to both legs, limited use of the legs, wearing AFOs Sensory: Sensory testing is intact to soft touch on all 4 extremities. No evidence of extinction is noted.  Coordination: Good finger-nose-finger with the right arm, difficulty with the left, cannot perform heel-to-shin Gait and station: Wheelchair-bound, cannot be ambulated Reflexes: Deep tendon reflexes are symmetric   DIAGNOSTIC DATA (LABS, IMAGING, TESTING) - I reviewed patient records, labs, notes, testing and imaging myself where available.  Lab Results  Component Value Date   WBC 6.7 04/11/2020   HGB 15.8 04/11/2020   HCT 47.4 04/11/2020   MCV 89 04/11/2020   PLT 213 04/11/2020      Component Value Date/Time   NA 139 04/11/2020 1501   K 4.6 04/11/2020 1501   CL 100 04/11/2020 1501   CO2 28 04/11/2020 1501   GLUCOSE 73 04/11/2020 1501   GLUCOSE 78 03/27/2014 1344   BUN 11 04/11/2020 1501   CREATININE 0.89 04/11/2020 1501   CALCIUM 9.8 04/11/2020 1501   PROT 7.2 04/11/2020 1501   ALBUMIN 4.7 04/11/2020 1501   AST 22 04/11/2020 1501   ALT 44 04/11/2020 1501   ALKPHOS 66 04/11/2020 1501   BILITOT 0.7 04/11/2020 1501   GFRNONAA 115 04/11/2020 1501   GFRAA 133 04/11/2020 1501   No results found for: CHOL, HDL, LDLCALC, LDLDIRECT, TRIG, CHOLHDL No results found for: 04/13/2020 No results found for: VITAMINB12 Lab Results  Component Value Date   TSH 2.120 04/11/2020   ASSESSMENT AND PLAN 30 y.o. year old male  has a past medical history of  Anxiety, Arthritis, Back pain, Cerebral palsy (HCC), Common migraine (04/11/2020), Constipation, Dry eyes, GERD (gastroesophageal reflux disease), History of migraine, History of MRSA infection, PONV (postoperative  nausea and vomiting), Seasonal allergies, and Spastic neurogenic bladder. here with:  1.  Cerebral palsy, paraparesis 2.  Migraine headache 3.  Baclofen pump placement-managed by [facility]   -Migraines well controlled -Continue propranolol, talked about reducing dose, but wants to stay at dose for now, continue Maxalt for acute headache -Continue tizanidine at current dose up to 3 times daily for startle reflex, other medications may be too sedation  -Would like to see Dr. Lucia Gaskins in 1 year to establish care, but I will then follow going forward  Margie Ege, AGNP-C, DNP 10/17/2021, 3:38 PM [facility] [address], [address] [place], [place] [address] [phone]

## 2021-11-01 ENCOUNTER — Other Ambulatory Visit: Payer: Self-pay | Admitting: Psychiatry

## 2021-11-01 MED ORDER — RIZATRIPTAN BENZOATE 10 MG PO TABS: 10.0000 mg | ORAL_TABLET | Freq: Three times a day (TID) | ORAL | 3 refills | Status: DC | PRN

## 2021-11-01 NOTE — Progress Notes (Signed)
Received call from patient as he has been experiencing a bad migraine. Took 2 Maxalt and wanted to know if he could take any more doses. Advised patient that he should not take more than 2 doses of Maxalt in one day. Advised that he can take 2 extra strength tylenol every 6 hours for pain relief and Zofran every 8 hours as needed for nausea. All questions answered.  Jose Christensen 11/01/21 3:51 PM

## 2022-10-22 ENCOUNTER — Ambulatory Visit: Payer: Medicare Other | Admitting: Neurology

## 2022-10-29 ENCOUNTER — Telehealth: Payer: Self-pay | Admitting: Neurology

## 2022-10-29 ENCOUNTER — Ambulatory Visit: Payer: Medicare Other | Admitting: Neurology

## 2022-10-29 NOTE — Telephone Encounter (Signed)
LVM and sent mychart msg informing pt of r/s needed for today's appt- MD out. 

## 2022-11-17 ENCOUNTER — Other Ambulatory Visit: Payer: Self-pay | Admitting: Neurology

## 2022-12-27 ENCOUNTER — Other Ambulatory Visit: Payer: Self-pay | Admitting: Neurology

## 2023-01-05 ENCOUNTER — Telehealth: Payer: Self-pay | Admitting: Neurology

## 2023-01-05 NOTE — Telephone Encounter (Signed)
..   Pt understands that although there may be some limitations with this type of visit, we will take all precautions to reduce any security or privacy concerns.  Pt understands that this will be treated like an in office visit and we will file with pt's insurance, and there may be a patient responsible charge related to this service. ? ?

## 2023-01-07 ENCOUNTER — Telehealth: Payer: Self-pay | Admitting: Neurology

## 2023-01-07 ENCOUNTER — Telehealth (INDEPENDENT_AMBULATORY_CARE_PROVIDER_SITE_OTHER): Payer: Medicare Other | Admitting: Neurology

## 2023-01-07 DIAGNOSIS — G43009 Migraine without aura, not intractable, without status migrainosus: Secondary | ICD-10-CM

## 2023-01-07 DIAGNOSIS — G809 Cerebral palsy, unspecified: Secondary | ICD-10-CM

## 2023-01-07 MED ORDER — RIZATRIPTAN BENZOATE 10 MG PO TABS: 10.0000 mg | ORAL_TABLET | Freq: Three times a day (TID) | ORAL | 4 refills | Status: DC | PRN

## 2023-01-07 MED ORDER — TIZANIDINE HCL 2 MG PO TABS: 2.0000 mg | ORAL_TABLET | Freq: Three times a day (TID) | ORAL | 4 refills | Status: DC

## 2023-01-07 MED ORDER — PROPRANOLOL HCL ER 160 MG PO CP24: 160.0000 mg | ORAL_CAPSULE | Freq: Every day | ORAL | 4 refills | Status: DC

## 2023-01-07 NOTE — Progress Notes (Signed)
PATIENT: Bradleigh Delly DOB: 01-29-1991  REASON FOR VISIT: follow up HISTORY FROM: patient  Virtual Visit via Video Note  I connected with Boris Sharper on 01/12/23 at  3:00 PM EDT by a video enabled telemedicine application and verified that I am speaking with the correct person using two identifiers.  Location: Patient: home Provider: office   I discussed the limitations of evaluation and management by telemedicine and the availability of in person appointments. The patient expressed understanding and agreed to proceed.   Follow Up Instructions:    I discussed the assessment and treatment plan with the patient. The patient was provided an opportunity to ask questions and all were answered. The patient agreed with the plan and demonstrated an understanding of the instructions.   The patient was advised to call back or seek an in-person evaluation if the symptoms worsen or if the condition fails to improve as anticipated.  I provided 20 minutes of non-face-to-face time during this encounter.   Melvenia Beam, MD  Primary Neurologist: Dr. Jaynee Eagles since Dr. Jannifer Franklin has retired  HISTORY OF PRESENT ILLNESS: Today 01/07/2023 Boris Sharper here today for follow-up with his mother. Headaches are well controlled on propranolol. Had 1 headache yesterday, but otherwise has been months. Hasn't had to use the Maxalt. Takes Tylenol. 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 Sees PA at [facility] for baclofen pump refill, doesn't want to switch all neurology care there. Is overall doing well. 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    Patient complains of symptoms per HPI as well as the following symptoms: migraine . Pertinent negatives and positives per HPI. All others negative   Update 09/26/2020 SS: Mr. Mattaliano is a 32 year old male with history of cerebral palsy and migraines.  He has a baclofen pump for spasticity of the legs and is on  tizanidine for startle reflex.  Takes propanolol, Maxalt for headaches.  Has been taking tizanidine 2 mg twice a day, versus prescribed 3 times daily.  Has not noted any change to the startle reflex with twice daily dosing.  Headaches remain well controlled, denies migraine headache since last seen in June.  Has not taken the Maxalt, usually takes Tylenol if he feels a headache coming on.  He is not one to complain.  Is in a wheelchair, is able to stand and transfer.  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  Presents today for evaluation accompanied by his mother.  HISTORY 04/11/2020 Dr. Jannifer Franklin: Mr. Akkerman is a 32 year old right-handed white male with a history of cerebral palsy and migraine headaches.  The patient began having events when he was 78 or 32 years old while in high school.  He would have sudden episodes where he would seem to get confused and spaced out.  He would have multiple events during the day.  He was set up for evaluation to the epilepsy unit at [facility], no seizures were found, but they determined that he had migraine headaches.  These episodes of spacing out have gone away, but he still has some headaches that may occur on average once a month.  He may take Tylenol for this with some benefit.  He has taken Nurtec previously which helped.  The headaches usually last about 4 hours but can last all day long and sometimes even up to a week.  The headaches  may be in the front or the back of the head.  Lack of sleep may bring on a headache but he does not note any other particular activating factors.  He reports no numbness or weakness with the events.  He denies photophobia but does have some phonophobia at times, he denies any nausea or vomiting.  The patient has a baclofen pump in place, he has significant spasticity of the legs.  He takes tizanidine 2 mg twice daily for a prominent startle reflex.  He is  sent to this office for further evaluation.   REVIEW OF SYSTEMS: Out of a complete 14 system review of symptoms, the patient complains only of the following symptoms, and all other reviewed systems are negative.  See HPI  ALLERGIES: Allergies  Allergen Reactions   5-Alpha Reductase Inhibitors    Adhesive [Tape] Other (See Comments)    Takes skin off   Compazine [Prochlorperazine] Other (See Comments)    Altered mental   Ditropan [Oxybutynin] Other (See Comments)    Beet red   Motrin [Ibuprofen] Other (See Comments)    Renal bleeding   Prevacid [Lansoprazole] Other (See Comments)    Internal pump was affected   Reglan [Metoclopramide] Other (See Comments)    Altered mental   Tegretol [Carbamazepine] Other (See Comments)    Vision changes   Erythromycin Rash   Sulfa Antibiotics Rash    HOME MEDICATIONS: Outpatient Medications Prior to Visit  Medication Sig Dispense Refill   acetaminophen (TYLENOL) 500 MG tablet Take 500 mg by mouth every 6 (six) hours as needed for headache.     baclofen (LIORESAL) 20 MG tablet Take 20 mg by mouth daily as needed for muscle spasms.     cetirizine (ZYRTEC) 10 MG tablet Take 10 mg by mouth daily.     desmopressin (DDAVP) 0.2 MG tablet Take 0.6 mg by mouth daily.     esomeprazole (NEXIUM) 40 MG capsule Take 40 mg by mouth daily at 12 noon.     fesoterodine (TOVIAZ) 8 MG TB24 tablet Take 8 mg by mouth daily.     mometasone (NASONEX) 50 MCG/ACT nasal spray Place 2 sprays into the nose daily.     polyethylene glycol (MIRALAX / GLYCOLAX) packet Take 17 g by mouth daily.     PRESCRIPTION MEDICATION Baclofen pump continuous     sertraline (ZOLOFT) 25 MG tablet Take 25 mg by mouth daily.     sertraline (ZOLOFT) 50 MG tablet Take 50 mg by mouth daily.      propranolol ER (INDERAL LA) 160 MG SR capsule TAKE ONE CAPSULE BY MOUTH ONE TIME DAILY 60 capsule 0   rizatriptan (MAXALT) 10 MG tablet Take 1 tablet (10 mg total) by mouth 3 (three) times daily as  needed for migraine. May repeat in 2 hours if needed 10 tablet 3   tiZANidine (ZANAFLEX) 2 MG tablet TAKE ONE TABLET BY MOUTH THREE TIMES A DAY 90 tablet 0   No facility-administered medications prior to visit.    PAST MEDICAL HISTORY: Past Medical History:  Diagnosis Date   Anxiety    takes Zoloft daily   Arthritis    hips   Back pain    scoliosis   Cerebral palsy (HCC)    spastic quadriplegic cerebral palsy   Common migraine 04/11/2020   Constipation    takes Miralax daily   Dry eyes    occasionally   GERD (gastroesophageal reflux disease)    takes Nexium daily    History of  migraine    takes Propranolol daily;last migraine a couple of months ago;mom states he spaces out and looses orientation to time   History of MRSA infection    several yrs ago   PONV (postoperative nausea and vomiting)    Seasonal allergies    uses Nasonex daily and takes Zyrtec daily   Spastic neurogenic bladder    takes toviaz daily    PAST SURGICAL HISTORY: Past Surgical History:  Procedure Laterality Date   arm surgery Bilateral    several times   bilateral eye surgery     couple of times   ESOPHAGOGASTRODUODENOSCOPY     LEG SURGERY Right    titanium rod   MYRINGOTOMY     orthodpedic surgeries     total of 23-metal plates in both hips   PROGRAMABLE BACLOFEN PUMP REVISION     x2    TOOTH EXTRACTION N/A 04/03/2014   Procedure: SURGICAL EXTRACTION TEETH #1 AND #16; DIFFICULT EXTRACTION OF IMPACTED TEETH #17 AND #32;  Surgeon: Isac Caddy, DDS;  Location: [place];  Service: Oral Surgery;  Laterality: N/A;    FAMILY HISTORY: Family History  Problem Relation Age of Onset   Multiple sclerosis Mother    Heart disease Mother    COPD Mother    Hypertension Mother     SOCIAL HISTORY: Social History   Socioeconomic History   Marital status: Single    Spouse name: Not on file   Number of children: Not on file   Years of education: Not on file   Highest education level: Not on  file  Occupational History   Not on file  Tobacco Use   Smoking status: Never   Smokeless tobacco: Never  Vaping Use   Vaping Use: Never used  Substance and Sexual Activity   Alcohol use: No   Drug use: No   Sexual activity: Never  Other Topics Concern   Not on file  Social History Narrative   Not on file   Social Determinants of Health   Financial Resource Strain: Not on file  Food Insecurity: Not on file  Transportation Needs: Not on file  Physical Activity: Not on file  Stress: Not on file  Social Connections: Not on file  Intimate Partner Violence: Not on file   PHYSICAL EXAM  There were no vitals filed for this visit.   There is no height or weight on file to calculate BMI.  Generalized: Well developed, in no acute distress  Neurological examination  Mentation: Alert oriented to time, place, history taking. Follows all commands speech and language fluent Cranial nerve II-XII: Pupils were equal round reactive to light. Extraocular movements were full, visual field were full on confrontational test. Facial sensation and strength were normal.  Head turning and shoulder shrug were normal and symmetric. Motor: left hand in flexion, strength is fairly well-maintained to both upper extremities, significant increased motor tone to both legs, limited use of the legs, wearing AFOs Sensory: Sensory testing is intact to soft touch on all 4 extremities. No evidence of extinction is noted.  Coordination: Good finger-nose-finger with the right arm, difficulty with the left, cannot perform heel-to-shin Gait and station: Wheelchair-bound, cannot be ambulated Reflexes: Deep tendon reflexes are symmetric   DIAGNOSTIC DATA (LABS, IMAGING, TESTING) - I reviewed patient records, labs, notes, testing and imaging myself where available.  Lab Results  Component Value Date   WBC 6.7 04/11/2020   HGB 15.8 04/11/2020   HCT 47.4 04/11/2020   MCV 89  04/11/2020   PLT 213 04/11/2020       Component Value Date/Time   NA 139 04/11/2020 1501   K 4.6 04/11/2020 1501   CL 100 04/11/2020 1501   CO2 28 04/11/2020 1501   GLUCOSE 73 04/11/2020 1501   GLUCOSE 78 03/27/2014 1344   BUN 11 04/11/2020 1501   CREATININE 0.89 04/11/2020 1501   CALCIUM 9.8 04/11/2020 1501   PROT 7.2 04/11/2020 1501   ALBUMIN 4.7 04/11/2020 1501   AST 22 04/11/2020 1501   ALT 44 04/11/2020 1501   ALKPHOS 66 04/11/2020 1501   BILITOT 0.7 04/11/2020 1501   GFRNONAA 115 04/11/2020 1501   GFRAA 133 04/11/2020 1501   No results found for: "CHOL", "HDL", "LDLCALC", "LDLDIRECT", "TRIG", "CHOLHDL" No results found for: "HGBA1C" No results found for: "VITAMINB12" Lab Results  Component Value Date   TSH 2.120 04/11/2020   ASSESSMENT AND PLAN 32 y.o. year old male  has a past medical history of Anxiety, Arthritis, Back pain, Cerebral palsy ([place]), Common migraine (04/11/2020), Constipation, Dry eyes, GERD (gastroesophageal reflux disease), History of migraine, History of MRSA infection, PONV (postoperative nausea and vomiting), Seasonal allergies, and Spastic neurogenic bladder. here with:  1.  Cerebral palsy, paraparesis 2.  Migraine headache 3.  Baclofen pump placement-managed by [facility]   -Migraines well controlled -Continue propranolol, talked about reducing dose, but wants to stay at dose for now, continue Maxalt for acute headache -Continue tizanidine at current dose up to 3 times daily for startle reflex, other medications may be too sedation  -Would like to see Dr. Jaynee Eagles in 1 year to establish care, but I will then follow going forward  Butler Denmark, AGNP-C, DNP 01/12/2023, 11:59 AM [facility] [address], [place], [place] [address] [phone]    PATIENT: Rupert Baumhardt DOB: 06-18-1991  REASON FOR VISIT: follow up HISTORY FROM: patient Primary Neurologist: Dr. Jaynee Eagles since Dr. Jannifer Franklin has retired  HISTORY OF PRESENT ILLNESS: Today 01/12/23 Boris Sharper here today for follow-up with his mother. Headaches are well controlled on propranolol. Had 1 headache yesterday, but otherwise has been months. Hasn't had to use the Maxalt. Takes Tylenol. 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 Sees PA at [facility] for baclofen pump refill, doesn't want to switch all neurology care there. Is overall doing well. 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    Update 09/26/2020 SS: Mr. Randel is a 32 year old male with history of cerebral palsy and migraines.  He has a baclofen pump for spasticity of the legs and is on tizanidine for startle reflex.  Takes propanolol, Maxalt for headaches.  Has been taking tizanidine 2 mg twice a day, versus prescribed 3 times daily.  Has not noted any change to the startle reflex with twice daily dosing.  Headaches remain well controlled, denies migraine headache since last seen in June.  Has not taken the Maxalt, usually takes Tylenol if he feels a headache coming on.  He is not one to complain.  Is in a wheelchair, is able to stand and transfer.  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  Presents today for evaluation accompanied by his mother.  HISTORY 04/11/2020 Dr. Jannifer Franklin: Mr. Genova is a 32 year old right-handed white male with a history of cerebral palsy and migraine headaches.  The patient began having events when he was 14 or 32  years old while in high school.  He would have sudden episodes where he would seem to get confused and spaced out.  He would have multiple events during the day.  He was set up for evaluation to the epilepsy unit at [facility], no seizures were found, but they determined that he had migraine headaches.  These episodes of spacing out have gone away, but he still has some headaches that may occur on average once a month.  He may take Tylenol for this with some benefit.  He has  taken Nurtec previously which helped.  The headaches usually last about 4 hours but can last all day long and sometimes even up to a week.  The headaches may be in the front or the back of the head.  Lack of sleep may bring on a headache but he does not note any other particular activating factors.  He reports no numbness or weakness with the events.  He denies photophobia but does have some phonophobia at times, he denies any nausea or vomiting.  The patient has a baclofen pump in place, he has significant spasticity of the legs.  He takes tizanidine 2 mg twice daily for a prominent startle reflex.  He is sent to this office for further evaluation.   REVIEW OF SYSTEMS: Out of a complete 14 system review of symptoms, the patient complains only of the following symptoms, and all other reviewed systems are negative.  See HPI  ALLERGIES: Allergies  Allergen Reactions   5-Alpha Reductase Inhibitors    Adhesive [Tape] Other (See Comments)    Takes skin off   Compazine [Prochlorperazine] Other (See Comments)    Altered mental   Ditropan [Oxybutynin] Other (See Comments)    Beet red   Motrin [Ibuprofen] Other (See Comments)    Renal bleeding   Prevacid [Lansoprazole] Other (See Comments)    Internal pump was affected   Reglan [Metoclopramide] Other (See Comments)    Altered mental   Tegretol [Carbamazepine] Other (See Comments)    Vision changes   Erythromycin Rash   Sulfa Antibiotics Rash    HOME MEDICATIONS: Outpatient Medications Prior to Visit  Medication Sig Dispense Refill   acetaminophen (TYLENOL) 500 MG tablet Take 500 mg by mouth every 6 (six) hours as needed for headache.     baclofen (LIORESAL) 20 MG tablet Take 20 mg by mouth daily as needed for muscle spasms.     cetirizine (ZYRTEC) 10 MG tablet Take 10 mg by mouth daily.     desmopressin (DDAVP) 0.2 MG tablet Take 0.6 mg by mouth daily.     esomeprazole (NEXIUM) 40 MG capsule Take 40 mg by mouth daily at 12 noon.      fesoterodine (TOVIAZ) 8 MG TB24 tablet Take 8 mg by mouth daily.     mometasone (NASONEX) 50 MCG/ACT nasal spray Place 2 sprays into the nose daily.     polyethylene glycol (MIRALAX / GLYCOLAX) packet Take 17 g by mouth daily.     PRESCRIPTION MEDICATION Baclofen pump continuous     sertraline (ZOLOFT) 25 MG tablet Take 25 mg by mouth daily.     sertraline (ZOLOFT) 50 MG tablet Take 50 mg by mouth daily.      propranolol ER (INDERAL LA) 160 MG SR capsule TAKE ONE CAPSULE BY MOUTH ONE TIME DAILY 60 capsule 0   rizatriptan (MAXALT) 10 MG tablet Take 1 tablet (10 mg total) by mouth 3 (three) times daily as needed for migraine. May repeat  in 2 hours if needed 10 tablet 3   tiZANidine (ZANAFLEX) 2 MG tablet TAKE ONE TABLET BY MOUTH THREE TIMES A DAY 90 tablet 0   No facility-administered medications prior to visit.    PAST MEDICAL HISTORY: Past Medical History:  Diagnosis Date   Anxiety    takes Zoloft daily   Arthritis    hips   Back pain    scoliosis   Cerebral palsy (HCC)    spastic quadriplegic cerebral palsy   Common migraine 04/11/2020   Constipation    takes Miralax daily   Dry eyes    occasionally   GERD (gastroesophageal reflux disease)    takes Nexium daily    History of migraine    takes Propranolol daily;last migraine a couple of months ago;mom states he spaces out and looses orientation to time   History of MRSA infection    several yrs ago   PONV (postoperative nausea and vomiting)    Seasonal allergies    uses Nasonex daily and takes Zyrtec daily   Spastic neurogenic bladder    takes toviaz daily    PAST SURGICAL HISTORY: Past Surgical History:  Procedure Laterality Date   arm surgery Bilateral    several times   bilateral eye surgery     couple of times   ESOPHAGOGASTRODUODENOSCOPY     LEG SURGERY Right    titanium rod   MYRINGOTOMY     orthodpedic surgeries     total of 23-metal plates in both hips   PROGRAMABLE BACLOFEN PUMP REVISION     x2     TOOTH EXTRACTION N/A 04/03/2014   Procedure: SURGICAL EXTRACTION TEETH #1 AND #16; DIFFICULT EXTRACTION OF IMPACTED TEETH #17 AND #32;  Surgeon: Isac Caddy, DDS;  Location: [place];  Service: Oral Surgery;  Laterality: N/A;    FAMILY HISTORY: Family History  Problem Relation Age of Onset   Multiple sclerosis Mother    Heart disease Mother    COPD Mother    Hypertension Mother     SOCIAL HISTORY: Social History   Socioeconomic History   Marital status: Single    Spouse name: Not on file   Number of children: Not on file   Years of education: Not on file   Highest education level: Not on file  Occupational History   Not on file  Tobacco Use   Smoking status: Never   Smokeless tobacco: Never  Vaping Use   Vaping Use: Never used  Substance and Sexual Activity   Alcohol use: No   Drug use: No   Sexual activity: Never  Other Topics Concern   Not on file  Social History Narrative   Not on file   Social Determinants of Health   Financial Resource Strain: Not on file  Food Insecurity: Not on file  Transportation Needs: Not on file  Physical Activity: Not on file  Stress: Not on file  Social Connections: Not on file  Intimate Partner Violence: Not on file   PHYSICAL EXAM  There were no vitals filed for this visit.   There is no height or weight on file to calculate BMI.   Physical exam: Exam: Gen: NAD, conversant      CV:  Denies palpitations or chest pain or SOB. VS: Breathing at a normal rate. Weight appears within normal limits. Not febrile. Eyes: Conjunctivae clear without exudates or hemorrhage  Neuro: Detailed Neurologic Exam  Speech:    Speech is normal; fluent and spontaneous with normal comprehension.  Cognition:    The patient is oriented to person, place, and time;   Cranial Nerves:    The pupils are equal, round, and reactive to light. Cannot perform fundoscopic exam. Visual fields are full to finger confrontation. Extraocular movements  are intact.  The face is symmetric with normal sensation. The palate elevates in the midline. Hearing intact. Voice is normal. Shoulder shrug is normal. The tongue has normal motion without fasciculations.   Coordination:    nml  Motor Observation:   no involuntary movements noted.  Posture:    Posture is normal. normal erect    Strength:    Strength is anti-gravity and symmetric in the upper limbs.      Sensation: intact to LT       DIAGNOSTIC DATA (LABS, IMAGING, TESTING) - I reviewed patient records, labs, notes, testing and imaging myself where available.  Lab Results  Component Value Date   WBC 6.7 04/11/2020   HGB 15.8 04/11/2020   HCT 47.4 04/11/2020   MCV 89 04/11/2020   PLT 213 04/11/2020      Component Value Date/Time   NA 139 04/11/2020 1501   K 4.6 04/11/2020 1501   CL 100 04/11/2020 1501   CO2 28 04/11/2020 1501   GLUCOSE 73 04/11/2020 1501   GLUCOSE 78 03/27/2014 1344   BUN 11 04/11/2020 1501   CREATININE 0.89 04/11/2020 1501   CALCIUM 9.8 04/11/2020 1501   PROT 7.2 04/11/2020 1501   ALBUMIN 4.7 04/11/2020 1501   AST 22 04/11/2020 1501   ALT 44 04/11/2020 1501   ALKPHOS 66 04/11/2020 1501   BILITOT 0.7 04/11/2020 1501   GFRNONAA 115 04/11/2020 1501   GFRAA 133 04/11/2020 1501   No results found for: "CHOL", "HDL", "LDLCALC", "LDLDIRECT", "TRIG", "CHOLHDL" No results found for: "HGBA1C" No results found for: "VITAMINB12" Lab Results  Component Value Date   TSH 2.120 04/11/2020   ASSESSMENT AND PLAN 32 y.o. year old male  has a past medical history of Anxiety, Arthritis, Back pain, Cerebral palsy ([place]), Common migraine (04/11/2020), Constipation, Dry eyes, GERD (gastroesophageal reflux disease), History of migraine, History of MRSA infection, PONV (postoperative nausea and vomiting), Seasonal allergies, and Spastic neurogenic bladder. here with:  1.  Cerebral palsy, paraparesis 2.  Migraine headache 3.  Baclofen pump placement-managed by [facility]   -Migraines well controlled -Continue propranolol, talked about reducing dose, but wants to stay at dose for now, continue Maxalt for acute headache -Continue tizanidine at current dose up to 3 times daily for startle reflex, other medications may be too sedation  -Would like to see Dr. Jaynee Eagles in 1 year to establish care, but then sarah will then follow going forward  Meds ordered this encounter  Medications   DISCONTD: propranolol ER (INDERAL LA) 160 MG SR capsule    Sig: Take 1 capsule (160 mg total) by mouth daily.    Dispense:  90 capsule    Refill:  4   DISCONTD: rizatriptan (MAXALT) 10 MG tablet    Sig: Take 1 tablet (10 mg total) by mouth 3 (three) times daily as needed for migraine. May repeat in 2 hours if needed    Dispense:  90 tablet    Refill:  4    This is a 90-day supply do not fill early.   DISCONTD: tiZANidine (ZANAFLEX) 2 MG tablet    Sig: Take 1 tablet (2 mg total) by mouth 3 (three) times daily.    Dispense:  270 tablet  Refill:  4   propranolol ER (INDERAL LA) 160 MG SR capsule    Sig: Take 1 capsule (160 mg total) by mouth daily.    Dispense:  90 capsule    Refill:  4   rizatriptan (MAXALT) 10 MG tablet    Sig: Take 1 tablet (10 mg total) by mouth 3 (three) times daily as needed for migraine. May repeat in 2 hours if needed    Dispense:  90 tablet    Refill:  4    This is a 90-day supply do not fill early.   tiZANidine (ZANAFLEX) 2 MG tablet    Sig: Take 1 tablet (2 mg total) by mouth 3 (three) times daily.    Dispense:  270 tablet    Refill:  Hurricane MD [facility] [address] [place],  [address] [phone]

## 2023-01-07 NOTE — Telephone Encounter (Signed)
Please schedule med refill appt next January or February with sarah can be virtual thanks

## 2023-01-12 ENCOUNTER — Encounter: Payer: Self-pay | Admitting: Neurology

## 2023-01-12 ENCOUNTER — Telehealth: Payer: Self-pay | Admitting: Neurology

## 2023-01-12 MED ORDER — TIZANIDINE HCL 2 MG PO TABS: 2.0000 mg | ORAL_TABLET | Freq: Three times a day (TID) | ORAL | 4 refills | Status: DC

## 2023-01-12 MED ORDER — RIZATRIPTAN BENZOATE 10 MG PO TABS: 10.0000 mg | ORAL_TABLET | Freq: Three times a day (TID) | ORAL | 4 refills | Status: DC | PRN

## 2023-01-12 MED ORDER — PROPRANOLOL HCL ER 160 MG PO CP24: 160.0000 mg | ORAL_CAPSULE | Freq: Every day | ORAL | 4 refills | Status: DC

## 2023-01-12 NOTE — Telephone Encounter (Signed)
Pt already scheduled with Sarah for 11/18/23 at 3:30pm for med refill

## 2023-01-12 NOTE — Telephone Encounter (Signed)
Follow up with NP one year for med refill please video is fine

## 2023-11-17 NOTE — Progress Notes (Unsigned)
   Virtual Visit via Video Note  I connected with Jose Christensen on 11/17/23 at  3:30 PM EST by a video enabled telemedicine application and verified that I am speaking with the correct person using two identifiers.  Location: Patient: at his home Provider: in the office    I discussed the limitations of evaluation and management by telemedicine and the availability of in person appointments. The patient expressed understanding and agreed to proceed.  History of Present Illness: Update November 18, 2023 SS: Had to have baclofen pump replacement emergently in Dec, was in the ICU, has recovered fairly well. Migraines have been doing good, 3 a month. Takes Maxalt with good benefit. Remains on Inderal LA 160 mg daily. Takes tizanidine 2 mg twice daily vs  for startle reflex. Taking 3 times daily was too sedating.   01/07/23 Dr. Lucia Gaskins: Jose Christensen here today for follow-up with his mother. Headaches are well controlled on propranolol. Had 1 headache yesterday, but otherwise has been months. Hasn't had to use the Maxalt. Takes Tylenol. 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 Sees PA at [facility] for baclofen pump refill, doesn't want to switch all neurology care there. Is overall doing well. Still works as Clinical cytogeneticist at [employer].    Observations/Objective: Via video visit, is alert and oriented, speech is clear and concise, seated in wheelchair  Assessment and Plan: 1.  Cerebral palsy, paraparesis 2.  Migraine headache 3.  Baclofen pump placement-managed by [facility]   -Migraines under good control -We will continue current medications.  Wishes to remain on current dose of propranolol.  Continue Maxalt as needed for acute migraine -Continue tizanidine 2 mg up to 3 times daily for startle reflex  Meds ordered this encounter  Medications   propranolol ER (INDERAL LA) 160 MG SR capsule    Sig: Take 1 capsule (160 mg total) by mouth daily.     Dispense:  90 capsule    Refill:  4   tiZANidine (ZANAFLEX) 2 MG tablet    Sig: Take 1 tablet (2 mg total) by mouth 3 (three) times daily.    Dispense:  270 tablet    Refill:  4   rizatriptan (MAXALT) 10 MG tablet    Sig: Take 1 tablet (10 mg total) by mouth 3 (three) times daily as needed for migraine. May repeat in 2 hours if needed    Dispense:  90 tablet    Refill:  4    This is a 90-day supply do not fill early.   Follow Up Instructions: 1 year via video visit   I discussed the assessment and treatment plan with the patient. The patient was provided an opportunity to ask questions and all were answered. The patient agreed with the plan and demonstrated an understanding of the instructions.   The patient was advised to call back or seek an in-person evaluation if the symptoms worsen or if the condition fails to improve as anticipated.  Otila Kluver, DNP  [facility] [address] [place], [place] [address] [phone]

## 2023-11-18 ENCOUNTER — Telehealth (INDEPENDENT_AMBULATORY_CARE_PROVIDER_SITE_OTHER): Payer: Medicare Other | Admitting: Neurology

## 2023-11-18 DIAGNOSIS — G809 Cerebral palsy, unspecified: Secondary | ICD-10-CM

## 2023-11-18 DIAGNOSIS — G43009 Migraine without aura, not intractable, without status migrainosus: Secondary | ICD-10-CM | POA: Diagnosis not present

## 2023-11-18 MED ORDER — PROPRANOLOL HCL ER 160 MG PO CP24: 160.0000 mg | ORAL_CAPSULE | Freq: Every day | ORAL | 4 refills | Status: AC

## 2023-11-18 MED ORDER — RIZATRIPTAN BENZOATE 10 MG PO TABS: 10.0000 mg | ORAL_TABLET | Freq: Three times a day (TID) | ORAL | 4 refills | Status: AC | PRN

## 2023-11-18 MED ORDER — TIZANIDINE HCL 2 MG PO TABS: 2.0000 mg | ORAL_TABLET | Freq: Three times a day (TID) | ORAL | 4 refills | Status: AC

## 2023-11-18 NOTE — Patient Instructions (Signed)
Great to see you today.  We will continue current medications.  I have sent in refills.  Will see you back in 1 year.  Thanks

## 2024-02-09 ENCOUNTER — Other Ambulatory Visit: Payer: Self-pay | Admitting: Neurology

## 2024-11-30 ENCOUNTER — Telehealth: Payer: Medicare Other | Admitting: Neurology
